# Patient Record
Sex: Male | Born: 1965 | Race: White | Hispanic: No | Marital: Married | Smoking: Never smoker
Health system: Southern US, Community
[De-identification: ages and names within clinical notes are randomized; demographics above are authoritative.]

## PROBLEM LIST (undated history)

## (undated) DIAGNOSIS — L0291 Cutaneous abscess, unspecified: Secondary | ICD-10-CM

## (undated) DIAGNOSIS — Z1589 Genetic susceptibility to other disease: Secondary | ICD-10-CM

## (undated) DIAGNOSIS — I10 Essential (primary) hypertension: Secondary | ICD-10-CM

## (undated) DIAGNOSIS — N183 Chronic kidney disease, stage 3 unspecified: Secondary | ICD-10-CM

## (undated) DIAGNOSIS — E785 Hyperlipidemia, unspecified: Secondary | ICD-10-CM

## (undated) DIAGNOSIS — I712 Thoracic aortic aneurysm, without rupture, unspecified: Secondary | ICD-10-CM

## (undated) DIAGNOSIS — M6282 Rhabdomyolysis: Secondary | ICD-10-CM

## (undated) HISTORY — DX: Cutaneous abscess, unspecified: L02.91

## (undated) HISTORY — PX: SPINE SURGERY: SHX786

## (undated) HISTORY — DX: Chronic kidney disease, stage 3 unspecified: N18.30

## (undated) HISTORY — DX: Essential (primary) hypertension: I10

## (undated) HISTORY — DX: Genetic susceptibility to other disease: Z15.89

## (undated) HISTORY — DX: Rhabdomyolysis: M62.82

## (undated) HISTORY — PX: IRRIGATION AND DEBRIDEMENT ABSCESS: SHX5252

## (undated) HISTORY — DX: Thoracic aortic aneurysm, without rupture, unspecified: I71.20

## (undated) HISTORY — DX: Hyperlipidemia, unspecified: E78.5

---

## 2000-11-17 ENCOUNTER — Ambulatory Visit: Admission: RE | Admit: 2000-11-17 | Discharge: 2000-11-17 | Payer: Self-pay | Admitting: Urology

## 2000-11-17 ENCOUNTER — Encounter: Payer: Self-pay | Admitting: Urology

## 2006-07-11 ENCOUNTER — Ambulatory Visit: Payer: Self-pay | Admitting: Internal Medicine

## 2006-07-15 ENCOUNTER — Encounter: Admission: RE | Admit: 2006-07-15 | Discharge: 2006-07-15 | Payer: Self-pay | Admitting: Internal Medicine

## 2006-08-13 ENCOUNTER — Inpatient Hospital Stay (HOSPITAL_COMMUNITY): Admission: RE | Admit: 2006-08-13 | Discharge: 2006-08-15 | Payer: Self-pay | Admitting: Neurosurgery

## 2006-10-27 ENCOUNTER — Encounter: Payer: Self-pay | Admitting: Internal Medicine

## 2007-02-26 ENCOUNTER — Encounter: Payer: Self-pay | Admitting: Internal Medicine

## 2008-04-14 ENCOUNTER — Encounter: Payer: Self-pay | Admitting: Internal Medicine

## 2009-06-20 ENCOUNTER — Encounter: Payer: Self-pay | Admitting: Internal Medicine

## 2010-10-11 NOTE — Letter (Signed)
Summary: [facility]  [facility]   Imported By: Maryln Gottron 02/01/2010 11:31:24  _____________________________________________________________________  External Attachment:    Type:   Image     Comment:   External Document

## 2010-10-11 NOTE — Letter (Signed)
Summary: [facility]  [facility]   Imported By: Maryln Gottron 02/01/2010 11:30:18  _____________________________________________________________________  External Attachment:    Type:   Image     Comment:   External Document

## 2011-09-06 ENCOUNTER — Inpatient Hospital Stay (HOSPITAL_COMMUNITY)
Admission: EM | Admit: 2011-09-06 | Discharge: 2011-09-07 | DRG: 278 | Disposition: A | Discharge status: Home / Self Care | Payer: BC Managed Care – PPO | Attending: General Surgery | Admitting: General Surgery

## 2011-09-06 ENCOUNTER — Other Ambulatory Visit: Payer: Self-pay

## 2011-09-06 ENCOUNTER — Encounter (HOSPITAL_COMMUNITY): Payer: Self-pay | Admitting: Registered Nurse

## 2011-09-06 ENCOUNTER — Emergency Department (HOSPITAL_COMMUNITY): Payer: BC Managed Care – PPO

## 2011-09-06 ENCOUNTER — Encounter (HOSPITAL_COMMUNITY)
Admission: EM | Disposition: A | Discharge status: Home / Self Care | Payer: Self-pay | Source: Home / Self Care | Attending: General Surgery

## 2011-09-06 ENCOUNTER — Encounter: Payer: Self-pay | Admitting: Emergency Medicine

## 2011-09-06 ENCOUNTER — Emergency Department (HOSPITAL_COMMUNITY): Payer: BC Managed Care – PPO | Admitting: Registered Nurse

## 2011-09-06 DIAGNOSIS — L03119 Cellulitis of unspecified part of limb: Principal | ICD-10-CM | POA: Diagnosis present

## 2011-09-06 DIAGNOSIS — L02419 Cutaneous abscess of limb, unspecified: Principal | ICD-10-CM | POA: Diagnosis present

## 2011-09-06 LAB — CBC
Platelets: 306 10*3/uL (ref 150–400)
RBC: 5.45 MIL/uL (ref 4.22–5.81)
RDW: 13.6 % (ref 11.5–15.5)
WBC: 9.8 10*3/uL (ref 4.0–10.5)

## 2011-09-06 LAB — DIFFERENTIAL
Basophils Absolute: 0.1 10*3/uL (ref 0.0–0.1)
Lymphocytes Relative: 13 % (ref 12–46)
Lymphs Abs: 1.2 10*3/uL (ref 0.7–4.0)
Neutrophils Relative %: 75 % (ref 43–77)

## 2011-09-06 LAB — POCT I-STAT, CHEM 8
BUN: 12 mg/dL (ref 6–23)
Creatinine, Ser: 1.3 mg/dL (ref 0.50–1.35)
Hemoglobin: 18 g/dL — ABNORMAL HIGH (ref 13.0–17.0)
Potassium: 4.2 mEq/L (ref 3.5–5.1)
Sodium: 138 mEq/L (ref 135–145)

## 2011-09-06 SURGERY — INCISION AND DRAINAGE, ABSCESS
Anesthesia: General | Site: Thigh | Laterality: Left | Wound class: Dirty or Infected

## 2011-09-06 MED ORDER — FENTANYL CITRATE 0.05 MG/ML IJ SOLN
INTRAMUSCULAR | Status: AC
  Filled 2011-09-06: qty 2

## 2011-09-06 MED ORDER — BUPIVACAINE-EPINEPHRINE 0.25% -1:200000 IJ SOLN
INTRAMUSCULAR | Status: AC
  Filled 2011-09-06: qty 1

## 2011-09-06 MED ORDER — IOHEXOL 300 MG/ML  SOLN: 100.0000 mL | Freq: Once | INTRAMUSCULAR | Status: DC | PRN

## 2011-09-06 MED ORDER — LACTATED RINGERS IV SOLN: INTRAVENOUS | Status: DC

## 2011-09-06 MED ORDER — MIDAZOLAM HCL 5 MG/5ML IJ SOLN
INTRAMUSCULAR | Status: DC | PRN
  Administered 2011-09-06: 2 mg via INTRAVENOUS

## 2011-09-06 MED ORDER — PROPOFOL 10 MG/ML IV EMUL
INTRAVENOUS | Status: DC | PRN
  Administered 2011-09-06: 200 mg via INTRAVENOUS

## 2011-09-06 MED ORDER — FENTANYL CITRATE 0.05 MG/ML IJ SOLN
INTRAMUSCULAR | Status: DC | PRN
  Administered 2011-09-06 (×2): 50 ug via INTRAVENOUS
  Administered 2011-09-06: 100 ug via INTRAVENOUS
  Administered 2011-09-06: 50 ug via INTRAVENOUS

## 2011-09-06 MED ORDER — BUPIVACAINE-EPINEPHRINE 0.25% -1:200000 IJ SOLN
INTRAMUSCULAR | Status: DC | PRN
  Administered 2011-09-06: 40 mL

## 2011-09-06 MED ORDER — FENTANYL CITRATE 0.05 MG/ML IJ SOLN: 25.0000 ug | INTRAMUSCULAR | Status: DC | PRN

## 2011-09-06 MED ORDER — PROMETHAZINE HCL 25 MG/ML IJ SOLN: 6.2500 mg | INTRAMUSCULAR | Status: DC | PRN

## 2011-09-06 MED ORDER — VANCOMYCIN HCL IN DEXTROSE 1-5 GM/200ML-% IV SOLN
1000.0000 mg | Freq: Once | INTRAVENOUS | Status: AC
  Administered 2011-09-06: 1000 mg via INTRAVENOUS
  Filled 2011-09-06: qty 200

## 2011-09-06 MED ORDER — 0.9 % SODIUM CHLORIDE (POUR BTL) OPTIME
TOPICAL | Status: DC | PRN
  Administered 2011-09-06: 1000 mL

## 2011-09-06 MED ORDER — ONDANSETRON HCL 4 MG/2ML IJ SOLN
INTRAMUSCULAR | Status: DC | PRN
  Administered 2011-09-06: 4 mg via INTRAVENOUS

## 2011-09-06 MED ORDER — HYDROMORPHONE HCL PF 1 MG/ML IJ SOLN
INTRAMUSCULAR | Status: DC | PRN
  Administered 2011-09-06 (×2): 1 mg via INTRAVENOUS

## 2011-09-06 MED ORDER — LACTATED RINGERS IV SOLN
INTRAVENOUS | Status: DC | PRN
  Administered 2011-09-06 (×2): via INTRAVENOUS

## 2011-09-06 MED ORDER — LIDOCAINE HCL 1 % IJ SOLN
INTRAMUSCULAR | Status: DC | PRN
  Administered 2011-09-06: 100 mg via INTRADERMAL

## 2011-09-06 SURGICAL SUPPLY — 34 items
BANDAGE GAUZE ELAST BULKY 4 IN (GAUZE/BANDAGES/DRESSINGS) ×1 IMPLANT
BLADE SURG 15 STRL LF DISP TIS (BLADE) ×1 IMPLANT
BLADE SURG 15 STRL SS (BLADE)
CANISTER SUCTION 2500CC (MISCELLANEOUS) ×2 IMPLANT
CLOTH BEACON ORANGE TIMEOUT ST (SAFETY) ×2 IMPLANT
COVER SURGICAL LIGHT HANDLE (MISCELLANEOUS) ×2 IMPLANT
DECANTER SPIKE VIAL GLASS SM (MISCELLANEOUS) ×1 IMPLANT
DRAPE LAPAROSCOPIC ABDOMINAL (DRAPES) IMPLANT
DRSG PAD ABDOMINAL 8X10 ST (GAUZE/BANDAGES/DRESSINGS) ×1 IMPLANT
ELECT CAUTERY BLADE 6.4 (BLADE) ×1 IMPLANT
ELECT REM PT RETURN 9FT ADLT (ELECTROSURGICAL) ×2
ELECTRODE REM PT RTRN 9FT ADLT (ELECTROSURGICAL) ×1 IMPLANT
GLOVE BIO SURGEON STRL SZ7.5 (GLOVE) ×2 IMPLANT
GLOVE SURG SS PI 6.5 STRL IVOR (GLOVE) ×2 IMPLANT
GOWN BRE IMP SLV SIRUS LXLNG (GOWN DISPOSABLE) ×1 IMPLANT
GOWN STRL NON-REIN LRG LVL3 (GOWN DISPOSABLE) ×4 IMPLANT
KIT BASIN OR (CUSTOM PROCEDURE TRAY) ×2 IMPLANT
NDL HYPO 25X1 1.5 SAFETY (NEEDLE) IMPLANT
NEEDLE HYPO 25X1 1.5 SAFETY (NEEDLE) ×2 IMPLANT
NS IRRIG 1000ML POUR BTL (IV SOLUTION) ×2 IMPLANT
PENCIL BUTTON HOLSTER BLD 10FT (ELECTRODE) ×1 IMPLANT
SPONGE GAUZE 4X4 12PLY (GAUZE/BANDAGES/DRESSINGS) ×1 IMPLANT
SPONGE LAP 18X18 X RAY DECT (DISPOSABLE) ×1 IMPLANT
SUT MNCRL AB 4-0 PS2 18 (SUTURE) IMPLANT
SUT VIC AB 3-0 SH 27 (SUTURE)
SUT VIC AB 3-0 SH 27XBRD (SUTURE) IMPLANT
SWAB COLLECTION DEVICE MRSA (MISCELLANEOUS) IMPLANT
SYR BULB 3OZ (MISCELLANEOUS) ×1 IMPLANT
SYR CONTROL 10ML LL (SYRINGE) ×1 IMPLANT
TAPE HYPAFIX 4 X10 (GAUZE/BANDAGES/DRESSINGS) ×1 IMPLANT
TOWEL OR 17X26 10 PK STRL BLUE (TOWEL DISPOSABLE) ×2 IMPLANT
TUBE ANAEROBIC SPECIMEN COL (MISCELLANEOUS) ×2 IMPLANT
WATER STERILE IRR 1000ML POUR (IV SOLUTION) IMPLANT
YANKAUER SUCT BULB TIP NO VENT (SUCTIONS) ×1 IMPLANT

## 2011-09-06 NOTE — Anesthesia Postprocedure Evaluation (Signed)
  Anesthesia Post-op Note  Patient: Brandon Phelps  Procedure(s) Performed:  INCISION AND DRAINAGE ABSCESS  Patient Location: PACU  Anesthesia Type: General  Level of Consciousness: awake, alert , oriented and patient cooperative  Airway and Oxygen Therapy: Patient Spontanous Breathing and Patient connected to nasal cannula oxygen  Post-op Pain: none  Post-op Assessment: Post-op Vital signs reviewed, Patient's Cardiovascular Status Stable and Respiratory Function Stable  Post-op Vital Signs: stable  Complications: No apparent anesthesia complications

## 2011-09-06 NOTE — Op Note (Signed)
Preoperative Diagnosis: ABCESS ON LEG abscess left thigh  Postoprative Diagnosis: ABCESS ON LEG abscess left thigh  Procedure: Procedure(s): INCISION AND DRAINAGE ABSCESS   Surgeon: Glenna Fellows T   Assistants: none  Anesthesia:  General endotracheal anesthesiaDiagnos  Indications:  Patient presents with a 2 to three-week history of increasing swelling and redness of his proximal left thigh. He partially treated this himself with antibiotics. He has a 10-15 cm area of firmness swelling and erythema over his proximal lateral left thigh. CT scan has shown an apparent multiloculated abscess in the deep soft tissue extending down to the superficial muscle. I recommended incision and drainage is brought to the operating room for this procedure. We discussed the nature of the procedure, risks of bleeding, infection, anesthetic complications and expected recovery and he is agreeable.   Procedure Detail: The patient the operating room and placed in the supine position on the operating table.Gen. Endotracheal anesthesia was induced. The thigh was sterilely prepped and draped. I made a longitudinal incision over the center of the area swelling and dissection was carried down through the subcutaneous tissue with cautery. In the deep subcutaneous tissue an abscess cavity was entered and a large amount of fairly thin gray green purulent fluid with what appeared to be some bright yellow flecks of possibly necrotic fat were drained. There were several loculations were broken up with careful blunt dissection and extended down to the fascia over the thigh muscles but really not down into the muscles. The drainage was cultured. The cavity was thoroughly irrigated with saline. Hemostasis was obtained with cautery. The soft tissue is infiltrated with Marcaine with epinephrine. The cavity was then packed with moist saline gauze. Dry dressing was applied and the patient taken to recovery room. Sponge needle  counts were correct.   Estimated Blood Loss:  less than 50 mL         Drains: none  Blood Given: none          Specimens: culture and sensitivity of abscess fluid        Complications:  * No complications entered in OR log *         Disposition: PACU - hemodynamically stable.         Condition: stable  Mariella Saa MD, FACS  09/06/2011, 10:43 PM

## 2011-09-06 NOTE — ED Notes (Signed)
Unable to obtain wound culture due to bleeding. Pressure dressing applied

## 2011-09-06 NOTE — ED Notes (Signed)
Pt transferred to surgery

## 2011-09-06 NOTE — ED Provider Notes (Signed)
History     CSN: [id number]  Arrival date & time 09/06/11  1517   First MD Initiated Contact with Patient 09/06/11 1549      Chief Complaint  Patient presents with  . Abscess    (Consider location/radiation/quality/duration/timing/severity/associated sxs/prior treatment) Patient is a 45 y.o. male presenting with abscess. The history is provided by the patient.  Abscess    patient presents with left thigh swelling x3 days. Patient had been diagnosed with having an abscess and placed on Bactrim. Symptoms have not been improving. He denies any fever or vomiting. Seen by his primary care physician today and the abscess was partially drained and packed with gauze. Patient sent to the ER due to concern for deep abscess. Pain is described as dull and made better with nothing and worse with ambulation.  History reviewed. No pertinent past medical history.  History reviewed. No pertinent past surgical history.  History reviewed. No pertinent family history.  History  Substance Use Topics  . Smoking status: Never Smoker   . Smokeless tobacco: Not on file  . Alcohol Use: No      Review of Systems  All other systems reviewed and are negative.    Allergies  Review of patient's allergies indicates no known allergies.  Home Medications   Current Outpatient Rx  Name Route Sig Dispense Refill  . SULFAMETHOXAZOLE-TRIMETHOPRIM 400-80 MG PO TABS Oral Take 1 tablet by mouth 2 (two) times daily.        BP 159/89  Pulse 109  Temp(Src) 98.5 F (36.9 C) (Oral)  Resp 20  SpO2 98%  Physical Exam  Nursing note and vitals reviewed. Constitutional: He is oriented to person, place, and time. He appears well-developed and well-nourished.  Non-toxic appearance. No distress.  HENT:  Head: Normocephalic and atraumatic.  Eyes: Conjunctivae, EOM and lids are normal. Pupils are equal, round, and reactive to light.  Neck: Normal range of motion. Neck supple. No tracheal deviation present. No  mass present.  Cardiovascular: Regular rhythm and normal heart sounds.  Tachycardia present.  Exam reveals no gallop.   No murmur heard. Pulmonary/Chest: Effort normal and breath sounds normal. No stridor. No respiratory distress. He has no decreased breath sounds. He has no wheezes. He has no rhonchi. He has no rales.  Abdominal: Soft. Normal appearance and bowel sounds are normal. He exhibits no distension. There is no tenderness. There is no rebound and no CVA tenderness.  Musculoskeletal: Normal range of motion. He exhibits no edema and no tenderness.       Legs: Neurological: He is alert and oriented to person, place, and time. He has normal strength. No cranial nerve deficit or sensory deficit. GCS eye subscore is 4. GCS verbal subscore is 5. GCS motor subscore is 6.  Skin: Skin is warm and dry. No abrasion and no rash noted.  Psychiatric: He has a normal mood and affect. His speech is normal and behavior is normal.    ED Course  Procedures (including critical care time)   Labs Reviewed  CBC  DIFFERENTIAL  I-STAT, CHEM 8  WOUND CULTURE   No results found.   No diagnosis found.    MDM  Patient started on vancomycin and spoke with the surgeon on call, he will come to see the pt        Toy Baker, MD 09/06/11 [phone]

## 2011-09-06 NOTE — H&P (Signed)
Brandon Phelps is an 45 y.o. male.   Chief Complaint: swelling and redness left thigh HPI: patient is a 44 year old male who about 2 weeks ago began to notice some redness and swelling in his proximal anterior thigh. He does not remember any injury or skin lesion. He has no previous history of soft tissue infections. The area got worse for several days. 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 The area persisted and got a little worse. He saw his primary physician today who unsuccessfully attempted incision and drainage in the office and he was then sent to wasn't long emergency room for evaluation. He denies fever or chills. It is mildly painful.  History reviewed. No pertinent past medical history.  Past Surgical History  Procedure Date  . Spine surgery Cervical Fusion    History reviewed. No pertinent family history. Social History:  reports that he has never smoked. He does not have any smokeless tobacco history on file. He reports that he does not drink alcohol or use illicit drugs.  Allergies: No Known Allergies  Medications Prior to Admission  Medication Dose Route Frequency Provider Last Rate Last Dose  . iohexol (OMNIPAQUE) 300 MG/ML solution 100 mL  100 mL Intravenous Once PRN Medication Radiologist      . vancomycin (VANCOCIN) IVPB 1000 mg/200 mL premix  1,000 mg Intravenous Once Toy Baker, MD   1,000 mg at 09/06/11 1755   No current outpatient prescriptions on file as of 09/06/2011.    Results for orders placed during the hospital encounter of 09/06/11 (from the past 48 hour(s))  CBC     Status: Normal   Collection Time   09/06/11  4:50 PM      Component Value Range Comment   WBC 9.8  4.0 - 10.5 (K/uL)    RBC 5.45  4.22 - 5.81 (MIL/uL)    Hemoglobin 16.4  13.0 - 17.0 (g/dL)    HCT 16.1  09.6 - 04.5 (%)    MCV 90.5  78.0 - 100.0 (fL)    MCH 30.1  26.0 - 34.0 (pg)    MCHC 33.3  30.0 - 36.0 (g/dL)    RDW 40.9  81.1 - 91.4 (%)    Platelets 306  150 - 400 (K/uL)   DIFFERENTIAL     Status: Abnormal   Collection Time   09/06/11  4:50 PM      Component Value Range Comment   Neutrophils Relative 75  43 - 77 (%)    Neutro Abs 7.3  1.7 - 7.7 (K/uL)    Lymphocytes Relative 13  12 - 46 (%)    Lymphs Abs 1.2  0.7 - 4.0 (K/uL)    Monocytes Relative 11  3 - 12 (%)    Monocytes Absolute 1.1 (*) 0.1 - 1.0 (K/uL)    Eosinophils Relative 1  0 - 5 (%)    Eosinophils Absolute 0.1  0.0 - 0.7 (K/uL)    Basophils Relative 1  0 - 1 (%)    Basophils Absolute 0.1  0.0 - 0.1 (K/uL)   POCT I-STAT, CHEM 8     Status: Abnormal   Collection Time   09/06/11  5:09 PM      Component Value Range Comment   Sodium 138  135 - 145 (mEq/L)    Potassium 4.2  3.5 - 5.1 (mEq/L)    Chloride 99  96 - 112 (mEq/L)  BUN 12  6 - 23 (mg/dL)    Creatinine, Ser 0.45  0.50 - 1.35 (mg/dL)    Glucose, Bld 83  70 - 99 (mg/dL)    Calcium, Ion 4.09  1.12 - 1.32 (mmol/L)    TCO2 31  0 - 100 (mmol/L)    Hemoglobin 18.0 (*) 13.0 - 17.0 (g/dL)    HCT 81.1 (*) 91.4 - 52.0 (%)    Ct Femur Left W Contrast  09/06/2011  *RADIOLOGY REPORT*  Clinical Data: Left anterior thigh swelling.  CT OF THE LEFT FEMUR WITH CONTRAST  Technique:  50 days axial CT images were obtained through the left femur after IV contrast, and multiplanar reformatting was performed.  Contrast:  100 ml Omnipaque-300  Comparison: None.  Findings: An irregular and multilobular abscess measuring 9.7 cm craniocaudad by 5.6 cm transverse by 3.9 cm anterior - posterior is present along the left anterior upper thigh, with components in the subcutaneous tissues and extending between the tensor fascia lata muscle and a rectus femoris muscle.  Surrounding subcutaneous edema is present, and on images 24-27 of series 2 there appears to be some subcutaneous gas and skin irregularity, possibly from recent drainage or sinus track formation.  The abscess may be slightly extending into the  rectus femoris muscle, and involvement is approximately from the vertical level of the lesser trochanter extending inferiorly.  No other abscess is observed.  No knee effusion is identified. Aside from in the direct vicinity of the abscess, fat planes along the muscle groups appear preserved.  No hip effusion is identified.  IMPRESSION:  1.  Abscess of the left upper thigh, with involvement of the subcutaneous tissues and likely extension into the anterior compartment, particularly between the tensor fascia lata and rectus femoris muscle, and possibly extending into the rectus femoris.  Original Report Authenticated By: Dellia Cloud, M.D.    Review of Systems  Constitutional: Negative for fever and chills.  Respiratory: Negative.   Cardiovascular: Negative.   Gastrointestinal: Negative.   Genitourinary: Negative.     Blood pressure 159/89, pulse 109, temperature 98.5 F (36.9 C), temperature source Oral, resp. rate 20, SpO2 98.00%. Physical Exam  General: Alert and healthy-appearing Caucasian male in no acute distress Skin: Warm and dry without rash or infection HEENT: No palpable mass or thyromegaly. Sclera nonicteric Lungs: Clear bilaterally without increased work of breathing Cardiovascular: Regular mild tachycardia. No murmur. No JVD or edema. Peripheral pulses intact. Abdomen: Soft and nontender. Nondistended. Extremities: In the proximal anterolateral left thigh a separate centimeters below the anterior superior iliac spine is an approximately 10-12 cm area of swelling, induration, and moderate overlying erythema. No definite fluctuance. There is a small stab wound centrally with a tiny bit of bloody drainage. Neurologic: Alert and fully oriented. Affect normal. Assessment/Plan Apparent large abscess involving the soft tissues of the proximal anterior left thigh. The source is not clear. He does not have fever or elevated white count but this has been partially treated with  antibiotics. I have reviewed the CT scan and there appears to be a fairly large multiloculated fluid collection extending down to the anterior musculature. I believe this will require open incision and drainage. I discussed this with the patient and his wife. 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 However I do not feel it is safe to manage this nonoperatively particularly with the CT scan findings. After discussion he  is agreeable to surgery and we will plan incision and drainage in the operating room tonight. We discussed the indications for the procedure, use of general anesthesia, and expected recovery period  Malu Pellegrini T 09/06/2011, 7:51 PM

## 2011-09-06 NOTE — ED Notes (Signed)
Pt has abscess on left upper thigh, swelling and redness noted. Went to PCP and I&D was performed but still got referred here to "have a [place]". Pt report that pcp didn't drained the whole abscess.

## 2011-09-06 NOTE — ED Notes (Addendum)
Pt is currently receiving antibiotic IV. Dr. Freida Busman informs to hold off the wound culture for now.

## 2011-09-06 NOTE — Anesthesia Preprocedure Evaluation (Signed)
Anesthesia Evaluation  Patient identified by MRN, date of birth, ID band Patient awake    Reviewed: Allergy & Precautions, H&P , NPO status , Patient's Chart, lab work & pertinent test results, reviewed documented beta blocker date and time   Airway Mallampati: I TM Distance: >3 FB Neck ROM: full    Dental No notable dental hx. (+) Teeth Intact and Dental Advidsory Given   Pulmonary neg pulmonary ROS,  clear to auscultation  Pulmonary exam normal       Cardiovascular Exercise Tolerance: Good neg cardio ROS regular Normal    Neuro/Psych Negative Neurological ROS  Negative Psych ROS   GI/Hepatic negative GI ROS, Neg liver ROS,   Endo/Other  Negative Endocrine ROS  Renal/GU negative Renal ROS  Genitourinary negative   Musculoskeletal   Abdominal Normal abdominal exam  (+)   Peds  Hematology negative hematology ROS (+)   Anesthesia Other Findings   Reproductive/Obstetrics negative OB ROS                           Anesthesia Physical Anesthesia Plan  ASA: I and Emergent  Anesthesia Plan: General   Post-op Pain Management:    Induction:   Airway Management Planned:   Additional Equipment:   Intra-op Plan:   Post-operative Plan:   Informed Consent: I have reviewed the patients History and Physical, chart, labs and discussed the procedure including the risks, benefits and alternatives for the proposed anesthesia with the patient or authorized representative who has indicated his/her understanding and acceptance.   Dental Advisory Given  Plan Discussed with: CRNA  Anesthesia Plan Comments:         Anesthesia Quick Evaluation

## 2011-09-06 NOTE — ED Notes (Signed)
Pt in c/o abscess to left leg, states he went to [facility] today and they were unable to open it up, sent here due to redness and swelling down to top of left knee, pt states he has been on antibiotic for this

## 2011-09-06 NOTE — Transfer of Care (Signed)
Immediate Anesthesia Transfer of Care Note  Patient: Brandon Phelps  Procedure(s) Performed:  INCISION AND DRAINAGE ABSCESS  Patient Location: PACU  Anesthesia Type: General  Level of Consciousness: awake and sedated  Airway & Oxygen Therapy: Patient Spontanous Breathing and Patient connected to face mask oxygen  Post-op Assessment: Report given to PACU RN and Post -op Vital signs reviewed and stable  Post vital signs: stable  Complications: No apparent anesthesia complications

## 2011-09-07 LAB — CBC
HCT: 45 % (ref 39.0–52.0)
MCH: 29.1 pg (ref 26.0–34.0)
MCHC: 32 g/dL (ref 30.0–36.0)
MCV: 91.1 fL (ref 78.0–100.0)
RDW: 13.6 % (ref 11.5–15.5)

## 2011-09-07 LAB — BASIC METABOLIC PANEL
BUN: 10 mg/dL (ref 6–23)
CO2: 30 mEq/L (ref 19–32)
Chloride: 98 mEq/L (ref 96–112)
Creatinine, Ser: 1.4 mg/dL — ABNORMAL HIGH (ref 0.50–1.35)
Glucose, Bld: 137 mg/dL — ABNORMAL HIGH (ref 70–99)

## 2011-09-07 MED ORDER — PIPERACILLIN-TAZOBACTAM 3.375 G IVPB
3.3750 g | Freq: Three times a day (TID) | INTRAVENOUS | Status: DC
  Administered 2011-09-07 (×2): 3.375 g via INTRAVENOUS
  Filled 2011-09-07 (×5): qty 50

## 2011-09-07 MED ORDER — VANCOMYCIN HCL 1000 MG IV SOLR
1250.0000 mg | Freq: Two times a day (BID) | INTRAVENOUS | Status: DC
  Administered 2011-09-07: 1250 mg via INTRAVENOUS
  Filled 2011-09-07 (×3): qty 1250

## 2011-09-07 MED ORDER — HYDROCODONE-ACETAMINOPHEN 5-325 MG PO TABS: 1.0000 | ORAL_TABLET | ORAL | Status: DC | PRN

## 2011-09-07 MED ORDER — MORPHINE SULFATE 2 MG/ML IJ SOLN: 2.0000 mg | INTRAMUSCULAR | Status: DC | PRN

## 2011-09-07 MED ORDER — KCL IN DEXTROSE-NACL 20-5-0.9 MEQ/L-%-% IV SOLN
INTRAVENOUS | Status: DC
  Administered 2011-09-07: 100 mL via INTRAVENOUS
  Filled 2011-09-07 (×4): qty 1000

## 2011-09-07 MED ORDER — ACETAMINOPHEN 650 MG RE SUPP: 650.0000 mg | Freq: Four times a day (QID) | RECTAL | Status: DC | PRN

## 2011-09-07 MED ORDER — HEPARIN SODIUM (PORCINE) 5000 UNIT/ML IJ SOLN
5000.0000 [IU] | Freq: Three times a day (TID) | INTRAMUSCULAR | Status: DC
  Administered 2011-09-07: 5000 [IU] via SUBCUTANEOUS
  Filled 2011-09-07 (×4): qty 1

## 2011-09-07 MED ORDER — OXYCODONE HCL 5 MG PO TABS: 5.0000 mg | ORAL_TABLET | ORAL | Status: DC | PRN

## 2011-09-07 MED ORDER — ACETAMINOPHEN 325 MG PO TABS: 650.0000 mg | ORAL_TABLET | Freq: Four times a day (QID) | ORAL | Status: DC | PRN

## 2011-09-07 MED ORDER — AMOXICILLIN-POT CLAVULANATE 875-125 MG PO TABS: 1.0000 | ORAL_TABLET | Freq: Two times a day (BID) | ORAL | Status: AC

## 2011-09-07 MED ORDER — ONDANSETRON HCL 4 MG/2ML IJ SOLN: 4.0000 mg | Freq: Four times a day (QID) | INTRAMUSCULAR | Status: DC | PRN

## 2011-09-07 NOTE — Progress Notes (Signed)
ANTIBIOTIC CONSULT NOTE - INITIAL  Pharmacy Consult for Vancomycin Indication: cellulitis  No Known Allergies  Patient Measurements: Height: 6\' 1"  (185.4 cm) Weight: 224 lb (101.606 kg) IBW/kg (Calculated) : 79.9  Adjusted Body Weight:   Vital Signs: Temp: 97.8 F (36.6 C) (12/29 0045) Temp src: Oral (12/29 0045) BP: 113/77 mmHg (12/29 0045) Pulse Rate: 76  (12/29 0045) Intake/Output from previous day: 12/28 0701 - 12/29 0700 In: 1440 [P.O.:240; I.V.:1200] Out: 100 [Blood:100] Intake/Output from this shift: Total I/O In: 1440 [P.O.:240; I.V.:1200] Out: 100 [Blood:100]  Labs:  Basename 09/06/11 1709 09/06/11 1650  WBC -- 9.8  HGB 18.0* 16.4  PLT -- 306  LABCREA -- --  CREATININE 1.30 --   Estimated Creatinine Clearance: 91.8 ml/min (by C-G formula based on Cr of 1.3). No results found for this basename: VANCOTROUGH:2,VANCOPEAK:2,VANCORANDOM:2,GENTTROUGH:2,GENTPEAK:2,GENTRANDOM:2,TOBRATROUGH:2,TOBRAPEAK:2,TOBRARND:2,AMIKACINPEAK:2,AMIKACINTROU:2,AMIKACIN:2, in the last 72 hours   Microbiology: No results found for this or any previous visit (from the past 720 hour(s)).  Medical History: History reviewed. No pertinent past medical history.  Medications:  Anti-infectives     Start     Dose/Rate Route Frequency Ordered Stop   09/07/11 0600   vancomycin (VANCOCIN) 1,250 mg in sodium chloride 0.9 % 250 mL IVPB        1,250 mg 166.7 mL/hr over 90 Minutes Intravenous Every 12 hours 09/07/11 0149     09/07/11 0004   piperacillin-tazobactam (ZOSYN) IVPB 3.375 g        3.375 g 12.5 mL/hr over 240 Minutes Intravenous 3 times per day 09/07/11 0004     09/06/11 1730   vancomycin (VANCOCIN) IVPB 1000 mg/200 mL premix        1,000 mg 200 mL/hr over 60 Minutes Intravenous  Once 09/06/11 1721 09/06/11 1855         Assessment: Patient with cellulitis.  First dose of antibiotics already given.   Goal of Therapy:  Vancomycin trough level 10-15 mcg/ml  Plan:  Measure  antibiotic drug levels at steady state Follow up culture results Vancomycin 1250mg  iv q12hr  Brandon Phelps, Brandon Phelps 09/07/2011,1:51 AM

## 2011-09-07 NOTE — Discharge Summary (Signed)
   Patient ID: Brandon Phelps [id number] 45 y.o. 1966/06/24  09/06/2011  Discharge date and time: 09/07/2011   Admitting Physician: Glenna Fellows T  Discharge Physician: Glenna Fellows T  Admission Diagnoses: ABCESS ON LEG abscess left thigh  Discharge Diagnoses: same  Operations: Procedure(s): INCISION AND DRAINAGE ABSCESS  Admission Condition: fair  Discharged Condition: good  Hospital Course: patient is a 45 year old male who presented with a three-week history of swelling redness and pain in his left anterior thigh. Exam showed a 8-10 cm area of erythema swelling and induration. CT confirmed a multiloculated abscess in the deep soft tissue. He underwent incision and drainage in the operating room. He received IV item mycin and Zosyn. On the first postoperative day he was feeling better. The wound was repacked and very clean. The erythema was markedly improved and he was afebrile with a normal white count. Cultures are pending. He is discharged home on oral Augmentin. Wound packing was explained. He is to return to the office in 6 days.  Consults: none   Treatments: surgery: incision and drainage of left thigh abscess  Disposition: Home  Patient Instructions:   Brandon Phelps, Brandon Phelps  Home Medication Instructions [id number]   Printed on:09/07/11 0859  Medication Information                    HYDROcodone-acetaminophen (NORCO) 5-325 MG per tablet Take 1-2 tablets by mouth every 4 (four) hours as needed for pain.           amoxicillin-clavulanate (AUGMENTIN) 875-125 MG per tablet Take 1 tablet by mouth 2 (two) times daily.             Activity: activity as tolerated Diet: regular diet Wound Care: daily  moist saline gauze wound pack  Follow-up:  With Dr. Johna Sheriff in 6 days.  Signed: Mariella Saa MD, FACS  09/07/2011, 8:59 AM

## 2011-09-07 NOTE — Progress Notes (Signed)
Patient ID: Brandon Phelps, male   DOB: 02-05-1966, 45 y.o.   MRN: [id number] 1 Day Post-Op  Subjective: No complaints. Denies pain.  Objective: Vital signs in last 24 hours: Temp:  [97.4 F (36.3 C)-98.6 F (37 C)] 97.8 F (36.6 C) (12/29 0610) Pulse Rate:  [60-109] 60  (12/29 0610) Resp:  [14-20] 18  (12/29 0610) BP: (101-159)/(57-89) 107/69 mmHg (12/29 0610) SpO2:  [91 %-98 %] 93 % (12/29 0610) FiO2 (%):  [2 %] 2 % (12/29 0045) Weight:  [224 lb (101.606 kg)] 224 lb (101.606 kg) (12/29 0100)    Intake/Output from previous day: 12/28 0701 - 12/29 0700 In: 1800 [P.O.:600; I.V.:1200] Out: 100 [Blood:100] Intake/Output this shift:    General appearance: alert and no distress Incision/Wound:left thigh wound repacked. It is clean with no purulent drainage. Erythema of his anterior thigh is markedly improved, essentially gone.  Lab Results:   Basename 09/07/11 0435 09/06/11 1709 09/06/11 1650  WBC 8.6 -- 9.8  HGB 14.4 18.0* --  HCT 45.0 53.0* --  PLT 282 -- 306   BMET  Basename 09/07/11 0435 09/06/11 1709  NA 135 138  K 4.6 4.2  CL 98 99  CO2 30 --  GLUCOSE 137* 83  BUN 10 12  CREATININE 1.40* 1.30  CALCIUM 9.0 --   PT/INR No results found for this basename: LABPROT:2,INR:2 in the last 72 hours ABG No results found for this basename: PHART:2,PCO2:2,PO2:2,HCO3:2 in the last 72 hours  Studies/Results: Ct Femur Left W Contrast  09/06/2011  *RADIOLOGY REPORT*  Clinical Data: Left anterior thigh swelling.  CT OF THE LEFT FEMUR WITH CONTRAST  Technique:  50 days axial CT images were obtained through the left femur after IV contrast, and multiplanar reformatting was performed.  Contrast:  100 ml Omnipaque-300  Comparison: None.  Findings: An irregular and multilobular abscess measuring 9.7 cm craniocaudad by 5.6 cm transverse by 3.9 cm anterior - posterior is present along the left anterior upper thigh, with components in the subcutaneous tissues and extending between the  tensor fascia lata muscle and a rectus femoris muscle.  Surrounding subcutaneous edema is present, and on images 24-27 of series 2 there appears to be some subcutaneous gas and skin irregularity, possibly from recent drainage or sinus track formation.  The abscess may be slightly extending into the rectus femoris muscle, and involvement is approximately from the vertical level of the lesser trochanter extending inferiorly.  No other abscess is observed.  No knee effusion is identified. Aside from in the direct vicinity of the abscess, fat planes along the muscle groups appear preserved.  No hip effusion is identified.  IMPRESSION:  1.  Abscess of the left upper thigh, with involvement of the subcutaneous tissues and likely extension into the anterior compartment, particularly between the tensor fascia lata and rectus femoris muscle, and possibly extending into the rectus femoris.  Original Report Authenticated By: Dellia Cloud, M.D.    Anti-infectives: Anti-infectives     Start     Dose/Rate Route Frequency Ordered Stop   09/07/11 0600   vancomycin (VANCOCIN) 1,250 mg in sodium chloride 0.9 % 250 mL IVPB        1,250 mg 166.7 mL/hr over 90 Minutes Intravenous Every 12 hours 09/07/11 0149     09/07/11 0004  piperacillin-tazobactam (ZOSYN) IVPB 3.375 g       3.375 g 12.5 mL/hr over 240 Minutes Intravenous 3 times per day 09/07/11 0004     09/06/11 1730   vancomycin (VANCOCIN)  IVPB 1000 mg/200 mL premix        1,000 mg 200 mL/hr over 60 Minutes Intravenous  Once 09/06/11 1721 09/06/11 1855          Assessment/Plan: s/p Procedure(s): INCISION AND DRAINAGE ABSCESS Doing well postoperatively. Signs of infection or much improved. I think he is stable for discharge. I instructed him in wound packing. He will be on Augmentin for one week and followup in the office in 6 days.   LOS: 1 day    Martell Mcfadyen T 09/07/2011

## 2011-09-09 ENCOUNTER — Telehealth (INDEPENDENT_AMBULATORY_CARE_PROVIDER_SITE_OTHER): Payer: Self-pay | Admitting: General Surgery

## 2011-09-10 LAB — CULTURE, ROUTINE-ABSCESS

## 2011-09-11 LAB — ANAEROBIC CULTURE

## 2011-09-13 ENCOUNTER — Ambulatory Visit (INDEPENDENT_AMBULATORY_CARE_PROVIDER_SITE_OTHER): Payer: BC Managed Care – PPO | Admitting: General Surgery

## 2011-09-13 ENCOUNTER — Encounter (INDEPENDENT_AMBULATORY_CARE_PROVIDER_SITE_OTHER): Payer: Self-pay | Admitting: General Surgery

## 2011-09-13 VITALS — BP 120/86 | HR 72 | Temp 98.6°F | Resp 12 | Ht 73.0 in | Wt 221.4 lb

## 2011-09-13 DIAGNOSIS — Z9889 Other specified postprocedural states: Secondary | ICD-10-CM

## 2011-09-13 NOTE — Patient Instructions (Signed)
Continue moist saline dressings until the skin is closed. Stop antibiotics after current prescription.

## 2011-09-13 NOTE — Progress Notes (Signed)
Patient returns one week following incision and drainage of a large abscess over his left anterior thigh. He reports he is doing well. He is up and around without difficulty.  Exam: Wound is all clean granulation about 4 x 2 x 2 cm and healing well. No induration or erythema.  Cultures did not grow any bacteria but he was on antibiotics preop  Assessment plan: Doing well following I&D of large thigh abscess. Continue current wound care. Return in 4 weeks.

## 2011-10-18 ENCOUNTER — Encounter (INDEPENDENT_AMBULATORY_CARE_PROVIDER_SITE_OTHER): Payer: BC Managed Care – PPO | Admitting: General Surgery

## 2015-04-28 ENCOUNTER — Encounter: Payer: Self-pay | Admitting: Family Medicine

## 2015-04-28 ENCOUNTER — Ambulatory Visit (INDEPENDENT_AMBULATORY_CARE_PROVIDER_SITE_OTHER): Payer: BLUE CROSS/BLUE SHIELD | Admitting: Family Medicine

## 2015-04-28 VITALS — BP 136/90 | HR 104 | Temp 98.1°F

## 2015-04-28 DIAGNOSIS — R000 Tachycardia, unspecified: Secondary | ICD-10-CM | POA: Diagnosis not present

## 2015-04-28 DIAGNOSIS — I309 Acute pericarditis, unspecified: Secondary | ICD-10-CM | POA: Diagnosis not present

## 2015-04-28 LAB — CBC WITH DIFFERENTIAL/PLATELET
BASOS ABS: 0.1 10*3/uL (ref 0.0–0.1)
Basophils Relative: 1 % (ref 0–1)
EOS PCT: 1 % (ref 0–5)
Eosinophils Absolute: 0.1 10*3/uL (ref 0.0–0.7)
HEMATOCRIT: 51 % (ref 39.0–52.0)
HEMOGLOBIN: 17.3 g/dL — AB (ref 13.0–17.0)
LYMPHS ABS: 1 10*3/uL (ref 0.7–4.0)
LYMPHS PCT: 16 % (ref 12–46)
MCH: 30.9 pg (ref 26.0–34.0)
MCHC: 33.9 g/dL (ref 30.0–36.0)
MCV: 91.1 fL (ref 78.0–100.0)
MONO ABS: 0.9 10*3/uL (ref 0.1–1.0)
MPV: 10.2 fL (ref 8.6–12.4)
Monocytes Relative: 14 % — ABNORMAL HIGH (ref 3–12)
Neutro Abs: 4.3 10*3/uL (ref 1.7–7.7)
Neutrophils Relative %: 68 % (ref 43–77)
Platelets: 196 10*3/uL (ref 150–400)
RBC: 5.6 MIL/uL (ref 4.22–5.81)
RDW: 14.3 % (ref 11.5–15.5)
WBC: 6.3 10*3/uL (ref 4.0–10.5)

## 2015-04-28 LAB — BASIC METABOLIC PANEL
BUN: 12 mg/dL (ref 7–25)
CHLORIDE: 101 mmol/L (ref 98–110)
CO2: 28 mmol/L (ref 20–31)
Calcium: 9.2 mg/dL (ref 8.6–10.3)
Creat: 1.48 mg/dL — ABNORMAL HIGH (ref 0.60–1.35)
GLUCOSE: 78 mg/dL (ref 70–99)
POTASSIUM: 4.4 mmol/L (ref 3.5–5.3)
Sodium: 139 mmol/L (ref 135–146)

## 2015-04-28 MED ORDER — METOPROLOL SUCCINATE ER 25 MG PO TB24: 25.0000 mg | ORAL_TABLET | Freq: Every day | ORAL | Status: DC

## 2015-04-28 NOTE — Progress Notes (Signed)
Subjective:    Patient ID: Brandon Phelps, male    DOB: [birth year], 49 y.o.   MRN: [id number]  HPI Patient is a very pleasant 49 year old white male with past medical history significant for hyperlipidemia. He also has a family history of coronary artery disease. However the patient is extremely active. He exercises routinely. He denies any dyspnea on exertion or angina. In fact this morning he played 18 holes of golf and walk the entire course without any chest pain or shortness of breath even in the heat. However earlier this week he developed low-grade fever and had symptoms consistent with a viral upper respiratory infection. Beginning last night he developed a rapid heartbeat. His heart rate is ranging between 90 and 100. He denies any shortness of breath. He does have some pain radiating in between his shoulder blades. This seems to be worse with twisting motions. EKG today shows normal sinus rhythm. There is depression in his PR intervals particularly in lead 2, 3, and aVF. He also has mild elevation in the ST segment in an upsloping pattern. I'm concerned this may represent viral pericarditis. He denies any shortness of breath. He does complain of a rapid heartbeat. He denies any pleurisy. He denies any hemoptysis. There is no swelling in his legs. He has no pitting edema. There is no evidence of a DVT. Past Medical History  Diagnosis Date  . Abscess     lt thigh   Past Surgical History  Procedure Laterality Date  . Spine surgery  Cervical Fusion  . Irrigation and debridement abscess      lt thigh   Current Outpatient Prescriptions on File Prior to Visit  Medication Sig Dispense Refill  . clobetasol (OLUX) 0.05 % topical foam Ad lib.     No current facility-administered medications on file prior to visit.   No Known Allergies Social History   Social History  . Marital Status: Married    Spouse Name: N/A  . Number of Children: N/A  . Years of Education: N/A   Occupational  History  . Not on file.   Social History Main Topics  . Smoking status: Never Smoker   . Smokeless tobacco: Not on file  . Alcohol Use: No  . Drug Use: No  . Sexual Activity: Not on file   Other Topics Concern  . Not on file   Social History Narrative      Review of Systems  All other systems reviewed and are negative.      Objective:   Physical Exam  Constitutional: He appears well-developed and well-nourished. No distress.  HENT:  Right Ear: External ear normal.  Left Ear: External ear normal.  Nose: Nose normal.  Mouth/Throat: Oropharynx is clear and moist. No oropharyngeal exudate.  Neck: Neck supple. No JVD present. No thyromegaly present.  Cardiovascular: Regular rhythm.  Tachycardia present.  Exam reveals no gallop and no friction rub.   No murmur heard. Pulmonary/Chest: Effort normal and breath sounds normal. No respiratory distress. He has no wheezes. He has no rales. He exhibits no tenderness.  Abdominal: Soft. Bowel sounds are normal.  Musculoskeletal: He exhibits no edema.  Lymphadenopathy:    He has no cervical adenopathy.  Skin: He is not diaphoretic.  Vitals reviewed.         Assessment & Plan:  Rapid heart beat - Plan: EKG 12-Lead, metoprolol succinate (TOPROL-XL) 25 MG 24 hr tablet, CBC with Differential, TSH, Basic Metabolic Panel  Acute pericarditis  Patient has tachycardia.  Given his abnormal EKG findings, his recent viral illness, his low-grade fever a few days ago, I believe the patient may have viral pericarditis. I have recommended that he take ibuprofen 800 mg every 8 hours. I will treat the tachycardia and the slight elevation in his blood pressure with Toprol-XL 25 mg by mouth daily. Certainly if the patient develops orthopnea, paroxysmal nocturnal dyspnea, or severe shortness of breath I want him to go immediately to the emergency room. I will also evaluate for other causes of tachycardia including checking a CBC, a TSH, and a BMP.  Recheck next week if no better or immediately if worse.

## 2015-04-29 LAB — TSH: TSH: 3.231 u[IU]/mL (ref 0.350–4.500)

## 2015-05-01 ENCOUNTER — Ambulatory Visit: Payer: Self-pay | Admitting: Family Medicine

## 2015-05-03 ENCOUNTER — Other Ambulatory Visit: Payer: Self-pay | Admitting: Family Medicine

## 2015-05-03 DIAGNOSIS — R944 Abnormal results of kidney function studies: Secondary | ICD-10-CM

## 2015-05-30 ENCOUNTER — Other Ambulatory Visit: Payer: BLUE CROSS/BLUE SHIELD

## 2015-05-30 DIAGNOSIS — R944 Abnormal results of kidney function studies: Secondary | ICD-10-CM

## 2015-05-30 DIAGNOSIS — E785 Hyperlipidemia, unspecified: Secondary | ICD-10-CM

## 2015-05-30 LAB — BASIC METABOLIC PANEL
BUN: 12 mg/dL (ref 7–25)
CHLORIDE: 101 mmol/L (ref 98–110)
CO2: 28 mmol/L (ref 20–31)
Calcium: 8.9 mg/dL (ref 8.6–10.3)
Creat: 1.26 mg/dL (ref 0.60–1.35)
GLUCOSE: 87 mg/dL (ref 70–99)
POTASSIUM: 4.7 mmol/L (ref 3.5–5.3)
SODIUM: 138 mmol/L (ref 135–146)

## 2015-05-30 LAB — LIPID PANEL
CHOL/HDL RATIO: 4.9 ratio (ref <=5.0)
CHOLESTEROL: 173 mg/dL (ref 125–200)
HDL: 35 mg/dL — ABNORMAL LOW (ref >=40)
LDL CALC: 119 mg/dL (ref <130)
Triglycerides: 94 mg/dL (ref <150)
VLDL: 19 mg/dL (ref <30)

## 2015-05-30 LAB — HEPATIC FUNCTION PANEL
ALT: 24 U/L (ref 9–46)
AST: 24 U/L (ref 10–40)
Albumin: 4.2 g/dL (ref 3.6–5.1)
Alkaline Phosphatase: 47 U/L (ref 40–115)
BILIRUBIN DIRECT: 0.2 mg/dL (ref <=0.2)
BILIRUBIN INDIRECT: 0.7 mg/dL (ref 0.2–1.2)
BILIRUBIN TOTAL: 0.9 mg/dL (ref 0.2–1.2)
Total Protein: 6.5 g/dL (ref 6.1–8.1)

## 2015-05-31 ENCOUNTER — Telehealth: Payer: Self-pay | Admitting: Family Medicine

## 2015-05-31 NOTE — Telephone Encounter (Signed)
PT IS CALLING FOR HIS LAB RESULTS. [phone]

## 2015-05-31 NOTE — Telephone Encounter (Signed)
Pt is aware that [facility] out of office today and will get a call back tomorrow - he would like for [place] to fax over his results. [phone]

## 2015-06-01 NOTE — Telephone Encounter (Signed)
Pt aware of results and labs faxed

## 2016-06-17 ENCOUNTER — Ambulatory Visit (INDEPENDENT_AMBULATORY_CARE_PROVIDER_SITE_OTHER): Payer: BLUE CROSS/BLUE SHIELD | Admitting: Family Medicine

## 2016-06-17 ENCOUNTER — Telehealth: Payer: Self-pay | Admitting: Family Medicine

## 2016-06-17 ENCOUNTER — Encounter: Payer: Self-pay | Admitting: Family Medicine

## 2016-06-17 VITALS — BP 136/80 | HR 80 | Temp 98.1°F | Resp 16 | Wt 227.0 lb

## 2016-06-17 DIAGNOSIS — N200 Calculus of kidney: Secondary | ICD-10-CM | POA: Diagnosis not present

## 2016-06-17 LAB — URINALYSIS, ROUTINE W REFLEX MICROSCOPIC
BILIRUBIN URINE: NEGATIVE
Leukocytes, UA: NEGATIVE
Nitrite: NEGATIVE
PH: 5.5 (ref 5.0–8.0)
Protein, ur: NEGATIVE
SPECIFIC GRAVITY, URINE: 1.025 (ref 1.001–1.035)

## 2016-06-17 LAB — URINALYSIS, MICROSCOPIC ONLY
CASTS: NONE SEEN [LPF]
CRYSTALS: NONE SEEN [HPF]
WBC, UA: NONE SEEN WBC/HPF (ref <=5)
YEAST: NONE SEEN [HPF]

## 2016-06-17 MED ORDER — KETOROLAC TROMETHAMINE 60 MG/2ML IM SOLN
60.0000 mg | Freq: Once | INTRAMUSCULAR | Status: AC
  Administered 2016-06-17: 60 mg via INTRAMUSCULAR

## 2016-06-17 MED ORDER — OXYCODONE-ACETAMINOPHEN 5-325 MG PO TABS: 1.0000 | ORAL_TABLET | ORAL | 0 refills | Status: DC | PRN

## 2016-06-17 MED ORDER — TAMSULOSIN HCL 0.4 MG PO CAPS
0.4000 mg | ORAL_CAPSULE | Freq: Every day | ORAL | 0 refills | Status: DC
Start: 2016-06-17 — End: 2017-04-14

## 2016-06-17 NOTE — Telephone Encounter (Signed)
Pt seen in office

## 2016-06-17 NOTE — Telephone Encounter (Signed)
Patient calling to say he is having pretty bad pain, from possible kidney stone and would like to talk to dr pickard about this asap  [phone]

## 2016-06-17 NOTE — Progress Notes (Signed)
   Subjective:    Patient ID: Brandon Phelps, male    DOB: 1966-04-02, 50 y.o.   MRN: [id number]  HPI Patient is a very pleasant 50 year old white male Presents with the sudden onset of left flank pain. The pain is sharp and intense. It comes and goes in waves. It radiates around towards his bladder. He has trace hematuria. The pain is so intense at times he is nauseated and dry heaving. Past Medical History:  Diagnosis Date  . Abscess    lt thigh   Past Surgical History:  Procedure Laterality Date  . IRRIGATION AND DEBRIDEMENT ABSCESS     lt thigh  . SPINE SURGERY  Cervical Fusion   No current outpatient prescriptions on file prior to visit.   No current facility-administered medications on file prior to visit.    No Known Allergies Social History   Social History  . Marital status: Married    Spouse name: N/A  . Number of children: N/A  . Years of education: N/A   Occupational History  . Not on file.   Social History Main Topics  . Smoking status: Never Smoker  . Smokeless tobacco: Not on file  . Alcohol use No  . Drug use: No  . Sexual activity: Not on file   Other Topics Concern  . Not on file   Social History Narrative  . No narrative on file      Review of Systems  All other systems reviewed and are negative.      Objective:   Physical Exam  Constitutional: He appears well-developed and well-nourished. No distress.  HENT:  Right Ear: External ear normal.  Left Ear: External ear normal.  Nose: Nose normal.  Mouth/Throat: Oropharynx is clear and moist. No oropharyngeal exudate.  Neck: Neck supple. No JVD present. No thyromegaly present.  Cardiovascular: Regular rhythm.  Tachycardia present.  Exam reveals no gallop and no friction rub.   No murmur heard. Pulmonary/Chest: Effort normal and breath sounds normal. No respiratory distress. He has no wheezes. He has no rales. He exhibits no tenderness.  Abdominal: Soft. Bowel sounds are normal.    Musculoskeletal: He exhibits no edema.  Lymphadenopathy:    He has no cervical adenopathy.  Skin: He is not diaphoretic.  Vitals reviewed.         Assessment & Plan:  Kidney stone - Plan: Urinalysis, Routine w reflex microscopic (not at [facility]), oxyCODONE-acetaminophen (ROXICET) 5-325 MG tablet, tamsulosin (FLOMAX) 0.4 MG CAPS capsule  Symptoms are consistent with a kidney stone. I recommended Percocet 5/325 one to 2 tablets every 4 hours as needed. He was given 60 mg of Toradol here. Begin Flomax 0.4 mg daily at bedtime to facilitate stone passage. Seek medical attention immediately should he develop a fever. We will monitor for 24-48 hours. If symptoms are worsening, we will proceed with a CAT scan immediately to determine the size of the stone and to determine if urology consultation as necessary. If symptoms are no better in 48 hours, we will proceed with a CAT scan to facilitate decision-making

## 2016-06-17 NOTE — Addendum Note (Signed)
Addended by: Shary Decamp B on: 06/17/2016 02:23 PM   Modules accepted: Orders

## 2017-04-14 ENCOUNTER — Ambulatory Visit (INDEPENDENT_AMBULATORY_CARE_PROVIDER_SITE_OTHER): Payer: BLUE CROSS/BLUE SHIELD | Admitting: Family Medicine

## 2017-04-14 ENCOUNTER — Encounter: Payer: Self-pay | Admitting: Family Medicine

## 2017-04-14 VITALS — BP 132/94 | HR 68 | Temp 98.3°F | Resp 12 | Ht 73.0 in | Wt 221.0 lb

## 2017-04-14 DIAGNOSIS — M7711 Lateral epicondylitis, right elbow: Secondary | ICD-10-CM

## 2017-04-14 MED ORDER — OMEGA-3-ACID ETHYL ESTERS 1 G PO CAPS: 2.0000 g | ORAL_CAPSULE | Freq: Two times a day (BID) | ORAL | 4 refills | Status: DC

## 2017-04-14 NOTE — Progress Notes (Signed)
   Subjective:    Patient ID: Brandon Phelps, male    DOB: 1966-07-08, 51 y.o.   MRN: [id number]  HPI  Patient reports pain in his right elbow for more than 2 months. Pain is located just distal to the lateral epicondyles. It hurts when he grips objects. It hurts when he tries to dorsiflex his wrist. Patient works as a Software engineer is and is constantly twisting on and off pill bottle caps and he believes that this is irritated his elbow. Past Medical History:  Diagnosis Date  . Abscess    lt thigh   Past Surgical History:  Procedure Laterality Date  . IRRIGATION AND DEBRIDEMENT ABSCESS     lt thigh  . SPINE SURGERY  Cervical Fusion   No current outpatient prescriptions on file prior to visit.   No current facility-administered medications on file prior to visit.    No Known Allergies Social History   Social History  . Marital status: Married    Spouse name: N/A  . Number of children: N/A  . Years of education: N/A   Occupational History  . Not on file.   Social History Main Topics  . Smoking status: Never Smoker  . Smokeless tobacco: Never Used  . Alcohol use No  . Drug use: No  . Sexual activity: Not on file   Other Topics Concern  . Not on file   Social History Narrative  . No narrative on file      Review of Systems  All other systems reviewed and are negative.      Objective:   Physical Exam  Cardiovascular: Normal rate, regular rhythm and normal heart sounds.   Pulmonary/Chest: Effort normal and breath sounds normal.  Musculoskeletal:       Right elbow: He exhibits normal range of motion, no swelling and no effusion. Tenderness found. Lateral epicondyle tenderness noted.  Vitals reviewed.         Assessment & Plan:  Right lateral epicondylitis  We discussed his options. I believe he has lateral epicondylitis. We discussed wearing an elbow strap and a wrist splint to prevent dorsiflexion in addition to taking anti-inflammatories. He states that  he's been wearing a elbow strap and taking ibuprofen without relief. He would like to proceed to a cortisone injection. Therefore using sterile technique, I injected the patient just distal to the lateral epicondyles at the point of maximum tenderness with a mixture of 1/2 mL of lidocaine and 1/2 mL of 40 mg per mL Kenalog. Recommended rest and that the patient wear an elbow strap for an additional 2 weeks after the injection. If not better recheck with me in 2 weeks

## 2017-05-01 ENCOUNTER — Other Ambulatory Visit: Payer: Self-pay | Admitting: Family Medicine

## 2017-05-01 NOTE — Telephone Encounter (Signed)
Ok to refill 

## 2017-05-01 NOTE — Telephone Encounter (Signed)
Ok, can we document what he is using for.  Was given originally for kidney stones.

## 2017-05-01 NOTE — Telephone Encounter (Signed)
Pt is taking for Kidney stones

## 2017-08-06 ENCOUNTER — Other Ambulatory Visit: Payer: Self-pay | Admitting: Family Medicine

## 2018-01-08 DIAGNOSIS — M25562 Pain in left knee: Secondary | ICD-10-CM | POA: Insufficient documentation

## 2018-08-21 ENCOUNTER — Encounter: Payer: Self-pay | Admitting: Family Medicine

## 2018-08-21 ENCOUNTER — Ambulatory Visit: Payer: BLUE CROSS/BLUE SHIELD | Admitting: Family Medicine

## 2018-08-21 VITALS — BP 140/92 | HR 82 | Temp 98.1°F | Resp 16 | Ht 73.0 in | Wt 233.0 lb

## 2018-08-21 DIAGNOSIS — R0789 Other chest pain: Secondary | ICD-10-CM | POA: Diagnosis not present

## 2018-08-21 MED ORDER — ATORVASTATIN CALCIUM 40 MG PO TABS: 40.0000 mg | ORAL_TABLET | Freq: Every day | ORAL | 3 refills | Status: DC

## 2018-08-21 MED ORDER — PANTOPRAZOLE SODIUM 40 MG PO TBEC: 40.0000 mg | DELAYED_RELEASE_TABLET | Freq: Every day | ORAL | 3 refills | Status: AC

## 2018-08-21 MED ORDER — LOSARTAN POTASSIUM 50 MG PO TABS: 50.0000 mg | ORAL_TABLET | Freq: Every day | ORAL | 3 refills | Status: AC

## 2018-08-21 NOTE — Progress Notes (Signed)
Subjective:    Patient ID: Brandon Phelps, male    DOB: 1965-11-14, 52 y.o.   MRN: [id number]  HPI Last night, the patient went to a Christmas party.  He had a large steak, drink 3 beers, and eating quite a bit.  He went home and fell asleep on the couch.  When he woke up to go to bed, he felt a pressure develop in the center of his chest while lying down in bed.  He began to panic concerned that it could be his heart.  He realized that he was anxious and took a Valium that he had at home and fell asleep.  When he woke up the pressure was gone.  He does admit to having more reflux lately.  He is also been having some pain radiate underneath his left shoulder blade.  Otherwise he has been doing well.  Patient states he has had no problems doing physical activity recently.  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  He denies any dyspnea on exertion.  He denies any pleurisy.  He denies any hemoptysis.  He denies any leg swelling.  EKG shows normal sinus rhythm but it does show nonspecific ST changes primarily in the lateral leads.  This is slightly different than his EKG from several years ago.  However there is no overt evidence of ischemia or infarction.  Patient's blood pressure today is elevated at 140/92.  He does have a family history of a mother who had heart attacks in early age although these were due to vasospasms of the coronary arteries.  His uncle also died from a heart attack in his 33s.  Patient has not been compliant with his statin. Past Medical History:  Diagnosis Date  . Abscess    lt thigh   Past Surgical History:  Procedure Laterality Date  . IRRIGATION AND DEBRIDEMENT ABSCESS     lt thigh  . SPINE SURGERY  Cervical Fusion   Current Outpatient Medications on File Prior to Visit  Medication Sig Dispense Refill  . clobetasol (OLUX) 0.05 % topical foam APPLY 1 GRAM ON THE SKIN AS DIRECTED 100 g 2  . omega-3 acid ethyl esters (LOVAZA)  1 g capsule Take 2 capsules (2 g total) by mouth 2 (two) times daily. 360 capsule 4   No current facility-administered medications on file prior to visit.    No Known Allergies Social History   Socioeconomic History  . Marital status: Married    Spouse name: Not on file  . Number of children: Not on file  . Years of education: Not on file  . Highest education level: Not on file  Occupational History  . Not on file  Social Needs  . Financial resource strain: Not on file  . Food insecurity:    Worry: Not on file    Inability: Not on file  . Transportation needs:    Medical: Not on file    Non-medical: Not on file  Tobacco Use  . Smoking status: Never Smoker  . Smokeless tobacco: Never Used  Substance and Sexual Activity  . Alcohol use: No  . Drug use: No  . Sexual activity: Not on file  Lifestyle  . Physical activity:    Days per week: Not on file    Minutes per session: Not on file  . Stress: Not on file  Relationships  . Social connections:    Talks on phone:  Not on file    Gets together: Not on file    Attends religious service: Not on file    Active member of club or organization: Not on file    Attends meetings of clubs or organizations: Not on file    Relationship status: Not on file  . Intimate partner violence:    Fear of current or ex partner: Not on file    Emotionally abused: Not on file    Physically abused: Not on file    Forced sexual activity: Not on file  Other Topics Concern  . Not on file  Social History Narrative  . Not on file    Past Medical History:  Diagnosis Date  . Abscess    lt thigh   Past Surgical History:  Procedure Laterality Date  . IRRIGATION AND DEBRIDEMENT ABSCESS     lt thigh  . SPINE SURGERY  Cervical Fusion   Current Outpatient Medications on File Prior to Visit  Medication Sig Dispense Refill  . clobetasol (OLUX) 0.05 % topical foam APPLY 1 GRAM ON THE SKIN AS DIRECTED 100 g 2  . omega-3 acid ethyl esters (LOVAZA)  1 g capsule Take 2 capsules (2 g total) by mouth 2 (two) times daily. 360 capsule 4  . oxyCODONE-acetaminophen (PERCOCET/ROXICET) 5-325 MG tablet TAKE 1 OR 2 TABLETS BY MOUTH EVERY 4 HOURS AS NEEDED FOR SEVERE PAIN 60 tablet 0   No current facility-administered medications on file prior to visit.    No Known Allergies Social History   Socioeconomic History  . Marital status: Married    Spouse name: Not on file  . Number of children: Not on file  . Years of education: Not on file  . Highest education level: Not on file  Occupational History  . Not on file  Social Needs  . Financial resource strain: Not on file  . Food insecurity:    Worry: Not on file    Inability: Not on file  . Transportation needs:    Medical: Not on file    Non-medical: Not on file  Tobacco Use  . Smoking status: Never Smoker  . Smokeless tobacco: Never Used  Substance and Sexual Activity  . Alcohol use: No  . Drug use: No  . Sexual activity: Not on file  Lifestyle  . Physical activity:    Days per week: Not on file    Minutes per session: Not on file  . Stress: Not on file  Relationships  . Social connections:    Talks on phone: Not on file    Gets together: Not on file    Attends religious service: Not on file    Active member of club or organization: Not on file    Attends meetings of clubs or organizations: Not on file    Relationship status: Not on file  . Intimate partner violence:    Fear of current or ex partner: Not on file    Emotionally abused: Not on file    Physically abused: Not on file    Forced sexual activity: Not on file  Other Topics Concern  . Not on file  Social History Narrative  . Not on file      Review of Systems  All other systems reviewed and are negative.      Objective:   Physical Exam  Constitutional: He appears well-developed and well-nourished. No distress.  HENT:  Right Ear: External ear normal.  Left Ear: External ear normal.  Nose: Nose  normal.    Mouth/Throat: Oropharynx is clear and moist. No oropharyngeal exudate.  Neck: Neck supple. No JVD present. No thyromegaly present.  Cardiovascular: Normal rate, regular rhythm and normal heart sounds. Exam reveals no gallop and no friction rub.  No murmur heard. Pulmonary/Chest: Effort normal and breath sounds normal. No respiratory distress. He has no wheezes. He has no rales. He exhibits no tenderness.  Abdominal: Soft. Bowel sounds are normal. He exhibits no distension. There is no abdominal tenderness. There is no rebound and no guarding.  Musculoskeletal:        General: No edema.  Lymphadenopathy:    He has no cervical adenopathy.  Skin: He is not diaphoretic.  Vitals reviewed.         Assessment & Plan:  Atypical chest pain - Plan: EKG 12-Lead  Given his family history of premature cardiovascular disease, and his history of hyperlipidemia and his borderline blood pressure, I will refer the patient to a cardiology for an outpatient stress test.  However I believe most likely the episode that happened last evening was GI related possibly due to reflux and gas coupled with anxiety.  I will treat the patient's blood pressure with losartan 50 mg a day.  I recommended that he take Lipitor 40 mg a day for his hyperlipidemia.  Until seen by cardiology I would take aspirin 81 mg a day.  Also recommended starting Protonix 40 mg a day as a suspect that he is having acid reflux causing the majority of his symptoms.  Await the results of the cardiology consultation and return fasting for a CMP and fasting lipid panel

## 2018-09-07 NOTE — Progress Notes (Signed)
Cardiology Office Note:    Date:  09/08/2018   ID:  Brandon Phelps, DOB [birth year], MRN [id number]  PCP:  Susy Frizzle, MD  Cardiologist:  Shirlee More, MD   Referring MD: Susy Frizzle, MD  ASSESSMENT:    1. Chest pain in adult   2. Dyslipidemia    PLAN:    In order of problems listed above:  1. After discussion of options and benefits he elects to undergo cardiac CTA for ischemia evaluation.  If high risk markers flow-limiting stenosis would need to consider revascularization.  I counseled him not to expect his calcium score to be 0 and he is aware of the value meaning of the test. 2. Check liver function on a statin recheck lipid profile for efficacy of his current high intensity statin and LP(a). 3. Continue beta-blocker ARB blood pressure at target   Next appointment 4 to 6 weeks   Medication Adjustments/Labs and Tests Ordered: Current medicines are reviewed at length with the patient today.  Concerns regarding medicines are outlined above.  Orders Placed This Encounter  Procedures  . CT CORONARY MORPH W/CTA COR W/SCORE W/CA W/CM &/OR WO/CM  . CT CORONARY FRACTIONAL FLOW RESERVE DATA PREP  . CT CORONARY FRACTIONAL FLOW RESERVE FLUID ANALYSIS  . Basic Metabolic Panel (BMET)  . Comprehensive Metabolic Panel (CMET)  . Lipid Profile  . Lipoprotein A (LPA)   Meds ordered this encounter  Medications  . metoprolol tartrate (LOPRESSOR) 50 MG tablet    Sig: Take 2 tablets (100 mg) by mouth 2 hours before your cardiac CTA.    Dispense:  2 tablet    Refill:  0     Chief Complaint  Patient presents with  . Chest Pain    History of Present Illness:    Brandon Phelps is a 52 y.o. male who is being seen today for the evaluation of chest pain at the request of Susy Frizzle, MD.  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  He has a family history of premature CAD.  He took a Biochemist, clinical out for Christmas to have a large meal at  [employer] and that evening he had an episode at rest where he was unable to take a deep breath he felt quite uncomfortable apprehensive and had fullness in his throat.  It subsided spontaneously after 10 to 15 minutes.  He is concerned about his potential for heart disease especially as he has a persistent discomfort and paresthesia in the left arm and has had cervical spine surgery.  He is not having typical exertional angina dyspnea palpitation or syncope he has no history of congenital or rheumatic heart disease was seen by his PCP and his surface EKG is abnormal with nonspecific ST abnormality.  We discussed methods for an ischemia evaluation reviewed the potential for traditional stress testing with imaging versus cardiac CTA is) family physician advised him to have a cardiac CTA he has no dye allergy and after review of the options and benefits he elects to undergo cardiac CTA as an ischemia evaluation.  He understands that likely he will not have a normal test but if he has high risk findings especially severe flow-limiting stenosis revascularization need to be considered.  He request to recheck a lipid profile along with CMP on a statin and lipoprotein little way with family history. Past Medical History:  Diagnosis Date  . Abscess    lt thigh  . Hyperlipidemia  Past Surgical History:  Procedure Laterality Date  . IRRIGATION AND DEBRIDEMENT ABSCESS     lt thigh  . SPINE SURGERY  Cervical Fusion    Current Medications: Current Meds  Medication Sig  . atorvastatin (LIPITOR) 40 MG tablet Take 1 tablet (40 mg total) by mouth daily.  . clobetasol (OLUX) 0.05 % topical foam APPLY 1 GRAM ON THE SKIN AS DIRECTED  . losartan (COZAAR) 50 MG tablet Take 1 tablet (50 mg total) by mouth daily.  . pantoprazole (PROTONIX) 40 MG tablet Take 1 tablet (40 mg total) by mouth daily.     Allergies:   Patient has no known allergies.   Social History   Socioeconomic History  . Marital status:  Married    Spouse name: Not on file  . Number of children: Not on file  . Years of education: Not on file  . Highest education level: Not on file  Occupational History  . Not on file  Social Needs  . Financial resource strain: Not on file  . Food insecurity:    Worry: Not on file    Inability: Not on file  . Transportation needs:    Medical: Not on file    Non-medical: Not on file  Tobacco Use  . Smoking status: Never Smoker  . Smokeless tobacco: Never Used  Substance and Sexual Activity  . Alcohol use: No  . Drug use: No  . Sexual activity: Not on file  Lifestyle  . Physical activity:    Days per week: Not on file    Minutes per session: Not on file  . Stress: Not on file  Relationships  . Social connections:    Talks on phone: Not on file    Gets together: Not on file    Attends religious service: Not on file    Active member of club or organization: Not on file    Attends meetings of clubs or organizations: Not on file    Relationship status: Not on file  Other Topics Concern  . Not on file  Social History Narrative  . Not on file     Family History: The patient's family history includes Heart attack in his maternal uncle and mother; Hyperlipidemia in his father; Hypertension in his father; Lung cancer in his maternal grandfather.  ROS:   Review of Systems  Constitution: Negative.  HENT: Negative.   Eyes: Negative.   Cardiovascular: Positive for chest pain (fullness in throat with SOb at rest).  Respiratory: Positive for shortness of breath.   Endocrine: Negative.   Hematologic/Lymphatic: Negative.   Skin: Negative.   Musculoskeletal: Positive for neck pain. Negative for joint pain.  Gastrointestinal: Negative.   Genitourinary: Negative.   Neurological: Positive for numbness and paresthesias.  Psychiatric/Behavioral: Negative.   Allergic/Immunologic: Negative.    Please see the history of present illness.     All other systems reviewed and are  negative.  EKGs/Labs/Other Studies Reviewed:    The following studies were reviewed today  EKG 2018/08/26 [facility] minor non specific ST abnormality  Recent Labs: No results found for requested labs within last 8760 hours.  Recent Lipid Panel    Component Value Date/Time   CHOL 173 05/30/2015 0826   TRIG 94 05/30/2015 0826   HDL 35 (L) 05/30/2015 0826   CHOLHDL 4.9 05/30/2015 0826   VLDL 19 05/30/2015 0826   LDLCALC 119 05/30/2015 0826    Physical Exam:    VS:  BP (!) 126/92 (BP Location: Left Arm, Patient  Position: Sitting, Cuff Size: Large)   Pulse 72   Ht 6\' 1"  (1.854 m)   Wt 234 lb (106.1 kg)   SpO2 96%   BMI 30.87 kg/m     Wt Readings from Last 3 Encounters:  09/08/18 234 lb (106.1 kg)  08/21/18 233 lb (105.7 kg)  04/14/17 221 lb (100.2 kg)     GEN:  Well nourished, well developed in no acute distress HEENT: Normal NECK: No JVD; No carotid bruits LYMPHATICS: No lymphadenopathy CARDIAC: RRR, no murmurs, rubs, gallops RESPIRATORY:  Clear to auscultation without rales, wheezing or rhonchi  ABDOMEN: Soft, non-tender, non-distended MUSCULOSKELETAL:  No edema; No deformity  SKIN: Warm and dry NEUROLOGIC:  Alert and oriented x 3 PSYCHIATRIC:  Normal affect     Signed, Shirlee More, MD  09/08/2018 9:33 AM    [place]

## 2018-09-08 ENCOUNTER — Encounter: Payer: Self-pay | Admitting: Cardiology

## 2018-09-08 ENCOUNTER — Ambulatory Visit (INDEPENDENT_AMBULATORY_CARE_PROVIDER_SITE_OTHER): Payer: Managed Care, Other (non HMO) | Admitting: Cardiology

## 2018-09-08 ENCOUNTER — Ambulatory Visit: Payer: Self-pay | Admitting: Cardiology

## 2018-09-08 VITALS — BP 126/92 | HR 72 | Ht 73.0 in | Wt 234.0 lb

## 2018-09-08 DIAGNOSIS — R079 Chest pain, unspecified: Secondary | ICD-10-CM

## 2018-09-08 DIAGNOSIS — E785 Hyperlipidemia, unspecified: Secondary | ICD-10-CM

## 2018-09-08 MED ORDER — METOPROLOL TARTRATE 50 MG PO TABS: ORAL_TABLET | ORAL | 0 refills | Status: DC

## 2018-09-08 NOTE — Patient Instructions (Signed)
Medication Instructions:  Your physician recommends that you continue on your current medications as directed. Please refer to the Current Medication list given to you today.  If you need a refill on your cardiac medications before your next appointment, please call your pharmacy.   Lab work: Your physician recommends that you return for lab work today: CMP, lipid panel, lipoprotein A.   Your physician recommends that you return for lab work within 3-7 days before your cardiac CTA: BMP. You can return to our office for lab work, no appointment needed. No need to fast beforehand.   If you have labs (blood work) drawn today and your tests are completely normal, you will receive your results only by: Marland Kitchen MyChart Message (if you have MyChart) OR . A paper copy in the mail If you have any lab test that is abnormal or we need to change your treatment, we will call you to review the results.  Testing/Procedures: Your physician has requested that you have cardiac CT. Cardiac computed tomography (CT) is a painless test that uses an x-ray machine to take clear, detailed pictures of your heart. For further information please visit [email or web address]. Please follow instruction sheet as given.   Please arrive at the [facility] main entrance of [facility] at xx:xx AM (30-45 minutes prior to test start time)  [facility] [place], [place] [address] [phone]  Proceed to the [facility] Radiology Department (First Floor).  Please follow these instructions carefully (unless otherwise directed):  Hold all erectile dysfunction medications at least 48 hours prior to test.  On the Night Before the Test: . Be sure to Drink plenty of water. . Do not consume any caffeinated/decaffeinated beverages or chocolate 12 hours prior to your test. . Do not take any antihistamines 12 hours prior to your test.  On the Day of the Test: . Drink plenty of water. Do not drink  any water within one hour of the test. . Do not eat any food 4 hours prior to the test. . You may take your regular medications prior to the test.  . Take metoprolol (Lopressor) two hours prior to test.                  -If HR is less than 55 BPM- No Beta Blocker                -IF HR is greater than 55 BPM and patient is less than or equal to 46 yrs old Lopressor 100mg  x1.                -If HR is greater than 55 BPM and patient is greater than 88 yrs old Lopressor 50 mg x1.         After the Test: . Drink plenty of water. . After receiving IV contrast, you may experience a mild flushed feeling. This is normal. . On occasion, you may experience a mild rash up to 24 hours after the test. This is not dangerous. If this occurs, you can take Benadryl 25 mg and increase your fluid intake. . If you experience trouble breathing, this can be serious. If it is severe call 911 IMMEDIATELY. If it is mild, please call our office.   Follow-Up: At [facility], you and your health needs are our priority.  As part of our continuing mission to provide you with exceptional heart care, we have created designated Provider Care Teams.  These Care Teams include  your primary Cardiologist (physician) and Advanced Practice Providers (APPs -  Physician Assistants and Nurse Practitioners) who all work together to provide you with the care you need, when you need it. You will need a follow up appointment in 6 weeks.        Cardiac CT Angiogram  A cardiac CT angiogram is a procedure to look at the heart and the area around the heart. It may be done to help find the cause of chest pains or other symptoms of heart disease. During this procedure, a large X-ray machine, called a CT scanner, takes detailed pictures of the heart and the surrounding area after a dye (contrast material) has been injected into blood vessels in the area. The procedure is also sometimes called a coronary CT angiogram, coronary artery  scanning, or CTA. A cardiac CT angiogram allows the health care provider to see how well blood is flowing to and from the heart. The health care provider will be able to see if there are any problems, such as:  Blockage or narrowing of the coronary arteries in the heart.  Fluid around the heart.  Signs of weakness or disease in the muscles, valves, and tissues of the heart. Tell a health care provider about:  Any allergies you have. This is especially important if you have had a previous allergic reaction to contrast dye.  All medicines you are taking, including vitamins, herbs, eye drops, creams, and over-the-counter medicines.  Any blood disorders you have.  Any surgeries you have had.  Any medical conditions you have.  Whether you are pregnant or may be pregnant.  Any anxiety disorders, chronic pain, or other conditions you have that may increase your stress or prevent you from lying still. What are the risks? Generally, this is a safe procedure. However, problems may occur, including:  Bleeding.  Infection.  Allergic reactions to medicines or dyes.  Damage to other structures or organs.  Kidney damage from the dye or contrast that is used.  Increased risk of cancer from radiation exposure. This risk is low. Talk with your health care provider about: ? The risks and benefits of testing. ? How you can receive the lowest dose of radiation. What happens before the procedure?  Wear comfortable clothing and remove any jewelry, glasses, dentures, and hearing aids.  Follow instructions from your health care provider about eating and drinking. This may include: ? For 12 hours before the test - avoid caffeine. This includes tea, coffee, soda, energy drinks, and diet pills. Drink plenty of water or other fluids that do not have caffeine in them. Being well-hydrated can prevent complications. ? For 4-6 hours before the test - stop eating and drinking. The contrast dye can cause  nausea, but this is less likely if your stomach is empty.  Ask your health care provider about changing or stopping your regular medicines. This is especially important if you are taking diabetes medicines, blood thinners, or medicines to treat erectile dysfunction. What happens during the procedure?  Hair on your chest may need to be removed so that small sticky patches called electrodes can be placed on your chest. These will transmit information that helps to monitor your heart during the test.  An IV tube will be inserted into one of your veins.  You might be given a medicine to control your heart rate during the test. This will help to ensure that good images are obtained.  You will be asked to lie on an exam table. This  table will slide in and out of the CT machine during the procedure.  Contrast dye will be injected into the IV tube. You might feel warm, or you may get a metallic taste in your mouth.  You will be given a medicine (nitroglycerin) to relax (dilate) the arteries in your heart.  The table that you are lying on will move into the CT machine tunnel for the scan.  The person running the machine will give you instructions while the scans are being done. You may be asked to: ? Keep your arms above your head. ? Hold your breath. ? Stay very still, even if the table is moving.  When the scanning is complete, you will be moved out of the machine.  The IV tube will be removed. The procedure may vary among health care providers and hospitals. What happens after the procedure?  You might feel warm, or you may get a metallic taste in your mouth from the contrast dye.  You may have a headache from the nitroglycerin.  After the procedure, drink water or other fluids to wash (flush) the contrast material out of your body.  Contact a health care provider if you have any symptoms of allergy to the contrast. These symptoms include: ? Shortness of breath. ? Rash or hives. ? A  racing heartbeat.  Most people can return to their normal activities right after the procedure. Ask your health care provider what activities are safe for you.  It is up to you to get the results of your procedure. Ask your health care provider, or the department that is doing the procedure, when your results will be ready. Summary  A cardiac CT angiogram is a procedure to look at the heart and the area around the heart. It may be done to help find the cause of chest pains or other symptoms of heart disease.  During this procedure, a large X-ray machine, called a CT scanner, takes detailed pictures of the heart and the surrounding area after a dye (contrast material) has been injected into blood vessels in the area.  Ask your health care provider about changing or stopping your regular medicines before the procedure. This is especially important if you are taking diabetes medicines, blood thinners, or medicines to treat erectile dysfunction.  After the procedure, drink water or other fluids to wash (flush) the contrast material out of your body. This information is not intended to replace advice given to you by your health care provider. Make sure you discuss any questions you have with your health care provider. Document Released: 08/08/2008 Document Revised: 07/15/2016 Document Reviewed: 07/15/2016 Elsevier Interactive Patient Education  2019 [employer].

## 2018-09-10 LAB — COMPREHENSIVE METABOLIC PANEL
ALT: 34 IU/L (ref 0–44)
AST: 31 IU/L (ref 0–40)
Albumin/Globulin Ratio: 2.2 (ref 1.2–2.2)
Albumin: 4.4 g/dL (ref 3.5–5.5)
Alkaline Phosphatase: 53 IU/L (ref 39–117)
BILIRUBIN TOTAL: 0.7 mg/dL (ref 0.0–1.2)
BUN/Creatinine Ratio: 8 — ABNORMAL LOW (ref 9–20)
BUN: 11 mg/dL (ref 6–24)
CHLORIDE: 104 mmol/L (ref 96–106)
CO2: 24 mmol/L (ref 20–29)
CREATININE: 1.34 mg/dL — AB (ref 0.76–1.27)
Calcium: 9.2 mg/dL (ref 8.7–10.2)
GFR calc non Af Amer: 61 mL/min/{1.73_m2} (ref >59)
GFR, EST AFRICAN AMERICAN: 71 mL/min/{1.73_m2} (ref >59)
GLUCOSE: 104 mg/dL — AB (ref 65–99)
Globulin, Total: 2 g/dL (ref 1.5–4.5)
Potassium: 4.5 mmol/L (ref 3.5–5.2)
Sodium: 144 mmol/L (ref 134–144)
TOTAL PROTEIN: 6.4 g/dL (ref 6.0–8.5)

## 2018-09-10 LAB — LIPID PANEL
CHOLESTEROL TOTAL: 135 mg/dL (ref 100–199)
Chol/HDL Ratio: 3.1 ratio (ref 0.0–5.0)
HDL: 43 mg/dL (ref >39)
LDL Calculated: 80 mg/dL (ref 0–99)
Triglycerides: 58 mg/dL (ref 0–149)
VLDL CHOLESTEROL CAL: 12 mg/dL (ref 5–40)

## 2018-09-10 LAB — LIPOPROTEIN A (LPA): Lipoprotein (a): 18.9 nmol/L (ref <75.0)

## 2018-09-11 ENCOUNTER — Ambulatory Visit: Payer: Self-pay | Admitting: Internal Medicine

## 2018-09-18 ENCOUNTER — Ambulatory Visit: Payer: Self-pay | Admitting: Cardiovascular Disease

## 2018-09-21 ENCOUNTER — Encounter (HOSPITAL_COMMUNITY): Payer: Self-pay | Admitting: Emergency Medicine

## 2018-09-21 ENCOUNTER — Other Ambulatory Visit: Payer: Self-pay

## 2018-09-21 ENCOUNTER — Emergency Department (HOSPITAL_COMMUNITY): Payer: Managed Care, Other (non HMO)

## 2018-09-21 ENCOUNTER — Emergency Department (HOSPITAL_COMMUNITY)
Admission: EM | Admit: 2018-09-21 | Discharge: 2018-09-21 | Disposition: A | Discharge status: Home / Self Care | Payer: Managed Care, Other (non HMO) | Attending: Emergency Medicine | Admitting: Emergency Medicine

## 2018-09-21 DIAGNOSIS — R079 Chest pain, unspecified: Secondary | ICD-10-CM

## 2018-09-21 DIAGNOSIS — Z79899 Other long term (current) drug therapy: Secondary | ICD-10-CM | POA: Diagnosis not present

## 2018-09-21 DIAGNOSIS — R0789 Other chest pain: Secondary | ICD-10-CM | POA: Insufficient documentation

## 2018-09-21 DIAGNOSIS — I10 Essential (primary) hypertension: Secondary | ICD-10-CM | POA: Diagnosis not present

## 2018-09-21 DIAGNOSIS — Z7982 Long term (current) use of aspirin: Secondary | ICD-10-CM | POA: Diagnosis not present

## 2018-09-21 DIAGNOSIS — R0602 Shortness of breath: Secondary | ICD-10-CM | POA: Insufficient documentation

## 2018-09-21 LAB — BASIC METABOLIC PANEL
Anion gap: 8 (ref 5–15)
BUN: 11 mg/dL (ref 6–20)
CO2: 26 mmol/L (ref 22–32)
Calcium: 9.3 mg/dL (ref 8.9–10.3)
Chloride: 103 mmol/L (ref 98–111)
Creatinine, Ser: 1.38 mg/dL — ABNORMAL HIGH (ref 0.61–1.24)
GFR calc Af Amer: >60 mL/min (ref >60)
GFR calc non Af Amer: 59 mL/min — ABNORMAL LOW (ref >60)
GLUCOSE: 113 mg/dL — AB (ref 70–99)
Potassium: 4.3 mmol/L (ref 3.5–5.1)
Sodium: 137 mmol/L (ref 135–145)

## 2018-09-21 LAB — CBC
HCT: 58.1 % — ABNORMAL HIGH (ref 39.0–52.0)
Hemoglobin: 19.1 g/dL — ABNORMAL HIGH (ref 13.0–17.0)
MCH: 30.6 pg (ref 26.0–34.0)
MCHC: 32.9 g/dL (ref 30.0–36.0)
MCV: 93 fL (ref 80.0–100.0)
Platelets: 260 K/uL (ref 150–400)
RBC: 6.25 MIL/uL — ABNORMAL HIGH (ref 4.22–5.81)
RDW: 13 % (ref 11.5–15.5)
WBC: 8.4 K/uL (ref 4.0–10.5)
nRBC: 0 % (ref 0.0–0.2)

## 2018-09-21 LAB — I-STAT TROPONIN, ED
Troponin i, poc: 0 ng/mL (ref 0.00–0.08)
Troponin i, poc: 0.01 ng/mL (ref 0.00–0.08)

## 2018-09-21 LAB — D-DIMER, QUANTITATIVE: D-Dimer, Quant: <0.27 ug{FEU}/mL (ref 0.00–0.50)

## 2018-09-21 MED ORDER — SODIUM CHLORIDE 0.9 % IV BOLUS
1000.0000 mL | Freq: Once | INTRAVENOUS | Status: AC
  Administered 2018-09-21: 1000 mL via INTRAVENOUS

## 2018-09-21 NOTE — ED Provider Notes (Signed)
[facility] Provider Note   CSN: [id number] Arrival date & time: 09/21/18  1019     History   Chief Complaint Chief Complaint  Patient presents with  . Chest Pain    HPI Brandon [facility] is a 53 y.o. male.  [facility] is a 53 y.o. male with history of hypertension, hyperlipidemia who presents to the emergency department for evaluation of left-sided chest pain.  He reports pain started this morning after arriving at work.  He reports its a dull pressure-like sensation over the left side of the chest.  He denies any radiation to the arm neck or jaw, does report some occasional paresthesias in the left hand, but reports this can happen occasionally ever since his C-spine surgery.  He reports he does feel short of breath but denies pleuritic chest pain.  Pain is not worse with exertion.  No associated diaphoresis, nausea or vomiting.  No lightheadedness or syncope.  He did take 325 of aspirin prior to arrival, no other medications to treat the symptoms.  Patient does report that symptoms started about an hour after he drink caffeinated tea, reports this does not typically bother him but it did take stronger than usual this morning.  Did note that his apple watch read with an elevated heart rate associated with the symptoms today, and he does report that he thinks since noting this he has been more anxious and this may be contributing.  Patient had one previous episode of chest pain and was recently seen by Dr. Bettina Gavia with cardiology and has a cardiac CTA scheduled for the 29th of this month.  Patient does have cardiac risk factors and hypertension, mild hyperlipidemia, recently started on Lipitor preventatively, and does have significant family history of multiple family members with early cardiac disease.  Patient denies any smoking history.  He is not diabetic. Denies lower extremity pain or swelling, recent travel or immobilization, history of PE or DVT,  family or personal history of bleeding or clotting disorders, cough or hemoptysis.     Past Medical History:  Diagnosis Date  . Abscess    lt thigh  . Hyperlipidemia   . Hypertension     Patient Active Problem List   Diagnosis Date Noted  . HLD (hyperlipidemia) 05/30/2015  . Abscess of left thigh 09/06/2011    Past Surgical History:  Procedure Laterality Date  . IRRIGATION AND DEBRIDEMENT ABSCESS     lt thigh  . SPINE SURGERY  Cervical Fusion        Home Medications    Prior to Admission medications   Medication Sig Start Date End Date Taking? Authorizing Provider  aspirin EC 325 MG tablet Take 325 mg by mouth daily.   Yes [provider]  atorvastatin (LIPITOR) 40 MG tablet Take 1 tablet (40 mg total) by mouth daily. 08/21/18  Yes Susy Frizzle, MD  clobetasol (OLUX) 0.05 % topical foam APPLY 1 GRAM ON THE SKIN AS DIRECTED Patient taking differently: Apply 1 g topically as directed. On the scalp 08/06/17  Yes Pickard, Cammie Mcgee, MD  losartan (COZAAR) 50 MG tablet Take 1 tablet (50 mg total) by mouth daily. 08/21/18  Yes Susy Frizzle, MD  pantoprazole (PROTONIX) 40 MG tablet Take 1 tablet (40 mg total) by mouth daily. 08/21/18  Yes Susy Frizzle, MD  metoprolol tartrate (LOPRESSOR) 50 MG tablet Take 2 tablets (100 mg) by mouth 2 hours before your cardiac CTA. 09/08/18   Shirlee More  J, MD    Family History Family History  Problem Relation Age of Onset  . Heart attack Mother   . Hypertension Father   . Hyperlipidemia Father   . Heart attack Maternal Uncle   . Lung cancer Maternal Grandfather     Social History Social History   Tobacco Use  . Smoking status: Never Smoker  . Smokeless tobacco: Never Used  Substance Use Topics  . Alcohol use: No  . Drug use: No     Allergies   Patient has no known allergies.   Review of Systems Review of Systems  Constitutional: Negative for activity change, appetite change, chills, diaphoresis  and fever.  Eyes: Negative for visual disturbance.  Respiratory: Positive for shortness of breath. Negative for cough, chest tightness and wheezing.   Cardiovascular: Positive for chest pain. Negative for palpitations and leg swelling.  Gastrointestinal: Negative for abdominal pain, nausea and vomiting.  Musculoskeletal: Negative for arthralgias and joint swelling.  Skin: Negative for color change and rash.  Neurological: Negative for dizziness, syncope, light-headedness and headaches.  All other systems reviewed and are negative.    Physical Exam Updated Vital Signs BP (!) 147/94 (BP Location: Right Arm)   Pulse (!) 113   Temp 97.9 F (36.6 C) (Oral)   Resp 18   SpO2 94%   Physical Exam Vitals signs and nursing note reviewed.  Constitutional:      General: He is not in acute distress.    Appearance: He is well-developed and normal weight. He is not ill-appearing or diaphoretic.  HENT:     Head: Normocephalic and atraumatic.  Eyes:     General:        Right eye: No discharge.        Left eye: No discharge.     Pupils: Pupils are equal, round, and reactive to light.  Neck:     Musculoskeletal: Neck supple.     Vascular: No JVD.     Trachea: No tracheal deviation.  Cardiovascular:     Rate and Rhythm: Regular rhythm. Tachycardia present.     Pulses:          Radial pulses are 2+ on the right side and 2+ on the left side.       Dorsalis pedis pulses are 2+ on the right side and 2+ on the left side.     Heart sounds: Normal heart sounds. No murmur. No friction rub. No gallop.   Pulmonary:     Effort: Pulmonary effort is normal. No respiratory distress.     Breath sounds: Normal breath sounds. No wheezing or rales.     Comments: Respirations equal and unlabored, patient able to speak in full sentences, lungs clear to auscultation bilaterally Chest:     Chest wall: No tenderness.  Abdominal:     General: Bowel sounds are normal. There is no distension.     Palpations:  Abdomen is soft. There is no mass.     Tenderness: There is no abdominal tenderness. There is no guarding.  Musculoskeletal:        General: No deformity.     Right lower leg: He exhibits no tenderness. No edema.     Left lower leg: He exhibits no tenderness. No edema.  Skin:    General: Skin is warm and dry.     Capillary Refill: Capillary refill takes less than 2 seconds.  Neurological:     General: No focal deficit present.     Mental Status: He  is alert and oriented to person, place, and time.     Coordination: Coordination normal.     Comments: Speech is clear, able to follow commands CN III-XII intact Normal strength in upper and lower extremities bilaterally including dorsiflexion and plantar flexion, strong and equal grip strength Sensation normal to light and sharp touch Moves extremities without ataxia, coordination intact  Psychiatric:        Mood and Affect: Mood normal.        Behavior: Behavior normal.      ED Treatments / Results  Labs (all labs ordered are listed, but only abnormal results are displayed) Labs Reviewed  BASIC METABOLIC PANEL - Abnormal; Notable for the following components:      Result Value   Glucose, Bld 113 (*)    Creatinine, Ser 1.38 (*)    GFR calc non Af Amer 59 (*)    All other components within normal limits  CBC - Abnormal; Notable for the following components:   RBC 6.25 (*)    Hemoglobin 19.1 (*)    HCT 58.1 (*)    All other components within normal limits  D-DIMER, QUANTITATIVE (NOT AT [facility])  I-STAT TROPONIN, ED  I-STAT TROPONIN, ED    EKG EKG Interpretation  Date/Time:  Monday September 21 2018 10:24:39 EST Ventricular Rate:  112 PR Interval:  146 QRS Duration: 90 QT Interval:  300 QTC Calculation: 409 R Axis:   26 Text Interpretation:  Sinus tachycardia Septal infarct , age undetermined Abnormal ECG No significant change since last tracing Confirmed by Dorie Rank [phone]) on 09/21/2018 11:24:35 AM   Radiology Dg  Chest 2 View  Result Date: 09/21/2018 CLINICAL DATA:  Chest pressure.  No shortness of breath. EXAM: CHEST - 2 VIEW COMPARISON:  No recent prior. FINDINGS: Mediastinum and hilar structures normal. Low lung volumes with mild bibasilar atelectasis. No pleural effusion or pneumothorax. Heart size normal. Thoracic spine fusion. IMPRESSION: No acute cardiopulmonary disease. Electronically Signed   By: Marcello Moores  Register   On: 09/21/2018 11:39    Procedures Procedures (including critical care time)  Medications Ordered in ED Medications  sodium chloride 0.9 % bolus 1,000 mL (0 mLs Intravenous Stopped 09/21/18 1410)     Initial Impression / Assessment and Plan / ED Course  I have reviewed the triage vital signs and the nursing notes.  Pertinent labs & imaging results that were available during my care of the patient were reviewed by me and considered in my medical decision making (see chart for details).  Presents to the emergency department for episode of left-sided chest pain that started today, upon arriving to the department chest pain seemed to significantly improve.  He did take 325 of aspirin prior to arrival, nothing else to treat his symptoms.  One prior episode of chest pain and was recently seen by cardiology, has cardiac CTA scheduled at the end of this month.  Risk factors for ACS include hypertension, mild hyperlipidemia and family history.  Patient also noted to be tachycardic on arrival, no prior history of DVT or PE and patient is not feeling short of breath but O2 sats remaining around 93%, will get d-dimer to rule out PE.  Symptoms do not seem consistent with aortic dissection.  Patient is not having any cough or fever to suggest pneumonia.  Will check basic labs, troponin, EKG, chest x-ray and d-dimer.  Will give IV fluids for tachycardia.  Patient did have caffeinated tea prior to arrival and I wonder if he could  be having some palpitations due to high level of caffeine that are causing  symptoms as well.  EKG with no significant changes since recent evaluation with cardiology and initial troponin is negative.  Patient does have an elevated hemoglobin of 19.1, on chart review patient has history of similarly elevated hemoglobins in the past, he reports he has never been evaluated by hematologist, patient does report that he frequently is red in the face and wonders if this could contribute to that also is frequently itchy after showers.  Will refer to hematology for outpatient evaluation of polycythemia.  No leukocytosis, no electrolytes requiring intervention.  Creatinine slightly elevated at 1.38 but this appears to be patient's baseline.  Chest x-ray with no active cardiopulmonary disease.  D-dimer is not elevated, patient ruled out for PE.  Delta troponin collected and is negative.  At this time I feel patient is stable for discharge home with low risk for acute cardiac event.  He has a cardiac CTA scheduled later this month and close follow-up with cardiologist.  I have discussed appropriate return precautions.  Patient expresses understanding and agreement with plan.  Stable for discharge home at this time.  Final Clinical Impressions(s) / ED Diagnoses   Final diagnoses:  Left-sided chest pain    ED Discharge Orders         Ordered    Ambulatory referral to Hematology / Oncology    Comments:  Polycythemia   09/21/18 1535           Benedetto Goad [place], [place] 09/21/18 1537    Dorie Rank, MD 09/23/18 [phone]

## 2018-09-21 NOTE — ED Notes (Signed)
Patient verbalizes understanding of discharge instructions. Opportunity for questioning and answers were provided. Armband removed by staff, pt discharged from ED ambulatory.   

## 2018-09-21 NOTE — ED Notes (Signed)
Patient transported to X-ray 

## 2018-09-21 NOTE — Discharge Instructions (Addendum)
Your work-up today was very reassuring and does not suggest an acute problem with your heart or lungs causing your chest pain.  I would like for you to follow-up closely with your cardiologist and continue with the plan for cardiac CTA later this month.  If you have repeat episodes of chest pain, any shortness of breath if you feel lightheaded or like you may pass out or any other new or concerning symptoms occur please return to the emergency department for reevaluation.  Follow-up with hematology regarding elevated red blood cell count.

## 2018-09-21 NOTE — ED Triage Notes (Signed)
Pt here from home  With chest pain tachycardia , no n/v some slight sob

## 2018-09-22 ENCOUNTER — Telehealth: Payer: Self-pay | Admitting: Internal Medicine

## 2018-09-22 ENCOUNTER — Encounter: Payer: Self-pay | Admitting: Internal Medicine

## 2018-09-22 NOTE — Telephone Encounter (Signed)
A hematology appt has been scheduled for the pt to see Dr. Walden Field on 1/31 at 1pm. Pt aware to arrive 30 minutes early. Letter mailed.

## 2018-10-03 LAB — BASIC METABOLIC PANEL
BUN / CREAT RATIO: 10 (ref 9–20)
BUN: 14 mg/dL (ref 6–24)
CO2: 24 mmol/L (ref 20–29)
Calcium: 9.2 mg/dL (ref 8.7–10.2)
Chloride: 100 mmol/L (ref 96–106)
Creatinine, Ser: 1.39 mg/dL — ABNORMAL HIGH (ref 0.76–1.27)
GFR calc Af Amer: 68 mL/min/{1.73_m2} (ref >59)
GFR, EST NON AFRICAN AMERICAN: 59 mL/min/{1.73_m2} — AB (ref >59)
Glucose: 106 mg/dL — ABNORMAL HIGH (ref 65–99)
POTASSIUM: 5 mmol/L (ref 3.5–5.2)
SODIUM: 142 mmol/L (ref 134–144)

## 2018-10-05 ENCOUNTER — Telehealth (HOSPITAL_COMMUNITY): Payer: Self-pay | Admitting: Emergency Medicine

## 2018-10-05 NOTE — Telephone Encounter (Signed)
Reaching out to patient to offer assistance regarding upcoming cardiac imaging study; pt verbalizes understanding of appt date/time, parking situation and where to check in, pre-test NPO status and medications ordered, and verified current allergies; name and call back number provided for further questions should they arise Kollen Armenti RN Navigator Cardiac Imaging [facility] [phone] office [phone] cell 

## 2018-10-07 ENCOUNTER — Ambulatory Visit (HOSPITAL_COMMUNITY)
Admission: RE | Admit: 2018-10-07 | Discharge: 2018-10-07 | Disposition: A | Discharge status: Home / Self Care | Payer: Managed Care, Other (non HMO) | Source: Ambulatory Visit | Attending: Cardiology | Admitting: Cardiology

## 2018-10-07 ENCOUNTER — Ambulatory Visit (HOSPITAL_COMMUNITY): Admission: RE | Admit: 2018-10-07 | Payer: Managed Care, Other (non HMO) | Source: Ambulatory Visit

## 2018-10-07 DIAGNOSIS — R079 Chest pain, unspecified: Secondary | ICD-10-CM

## 2018-10-07 MED ORDER — NITROGLYCERIN 0.4 MG SL SUBL
0.8000 mg | SUBLINGUAL_TABLET | Freq: Once | SUBLINGUAL | Status: AC
  Administered 2018-10-07: 0.8 mg via SUBLINGUAL
  Filled 2018-10-07: qty 25

## 2018-10-07 MED ORDER — IOPAMIDOL (ISOVUE-370) INJECTION 76%
80.0000 mL | Freq: Once | INTRAVENOUS | Status: AC | PRN
  Administered 2018-10-07: 80 mL via INTRAVENOUS

## 2018-10-07 MED ORDER — NITROGLYCERIN 0.4 MG SL SUBL
SUBLINGUAL_TABLET | SUBLINGUAL | Status: AC
  Filled 2018-10-07: qty 2

## 2018-10-09 ENCOUNTER — Inpatient Hospital Stay: Payer: Managed Care, Other (non HMO)

## 2018-10-09 ENCOUNTER — Telehealth: Payer: Self-pay | Admitting: *Deleted

## 2018-10-09 ENCOUNTER — Inpatient Hospital Stay: Payer: Managed Care, Other (non HMO) | Attending: Internal Medicine | Admitting: Internal Medicine

## 2018-10-09 ENCOUNTER — Encounter: Payer: Self-pay | Admitting: Internal Medicine

## 2018-10-09 VITALS — BP 144/91 | HR 73 | Temp 98.0°F | Resp 18 | Ht 73.0 in | Wt 233.4 lb

## 2018-10-09 DIAGNOSIS — L539 Erythematous condition, unspecified: Secondary | ICD-10-CM

## 2018-10-09 DIAGNOSIS — D751 Secondary polycythemia: Secondary | ICD-10-CM

## 2018-10-09 DIAGNOSIS — R079 Chest pain, unspecified: Secondary | ICD-10-CM

## 2018-10-09 DIAGNOSIS — I10 Essential (primary) hypertension: Secondary | ICD-10-CM

## 2018-10-09 DIAGNOSIS — Z7982 Long term (current) use of aspirin: Secondary | ICD-10-CM | POA: Insufficient documentation

## 2018-10-09 DIAGNOSIS — Z801 Family history of malignant neoplasm of trachea, bronchus and lung: Secondary | ICD-10-CM | POA: Diagnosis not present

## 2018-10-09 DIAGNOSIS — N289 Disorder of kidney and ureter, unspecified: Secondary | ICD-10-CM

## 2018-10-09 DIAGNOSIS — R102 Pelvic and perineal pain: Secondary | ICD-10-CM

## 2018-10-09 DIAGNOSIS — M25559 Pain in unspecified hip: Secondary | ICD-10-CM

## 2018-10-09 DIAGNOSIS — M79659 Pain in unspecified thigh: Secondary | ICD-10-CM

## 2018-10-09 LAB — CBC WITH DIFFERENTIAL/PLATELET
Abs Immature Granulocytes: 0.02 10*3/uL (ref 0.00–0.07)
BASOS PCT: 1 %
Basophils Absolute: 0.1 10*3/uL (ref 0.0–0.1)
Eosinophils Absolute: 0.1 10*3/uL (ref 0.0–0.5)
Eosinophils Relative: 1 %
HCT: 58.2 % — ABNORMAL HIGH (ref 39.0–52.0)
Hemoglobin: 19.3 g/dL — ABNORMAL HIGH (ref 13.0–17.0)
Immature Granulocytes: 0 %
Lymphocytes Relative: 25 %
Lymphs Abs: 1.9 10*3/uL (ref 0.7–4.0)
MCH: 30.3 pg (ref 26.0–34.0)
MCHC: 33.2 g/dL (ref 30.0–36.0)
MCV: 91.4 fL (ref 80.0–100.0)
Monocytes Absolute: 0.7 10*3/uL (ref 0.1–1.0)
Monocytes Relative: 9 %
Neutro Abs: 5 10*3/uL (ref 1.7–7.7)
Neutrophils Relative %: 64 %
Platelets: 233 10*3/uL (ref 150–400)
RBC: 6.37 MIL/uL — ABNORMAL HIGH (ref 4.22–5.81)
RDW: 12.9 % (ref 11.5–15.5)
WBC: 7.8 10*3/uL (ref 4.0–10.5)
nRBC: 0 % (ref 0.0–0.2)

## 2018-10-09 LAB — SEDIMENTATION RATE: Sed Rate: 0 mm/hr (ref 0–16)

## 2018-10-09 LAB — COMPREHENSIVE METABOLIC PANEL
ALT: 44 U/L (ref 0–44)
AST: 33 U/L (ref 15–41)
Albumin: 4.6 g/dL (ref 3.5–5.0)
Alkaline Phosphatase: 62 U/L (ref 38–126)
Anion gap: 10 (ref 5–15)
BUN: 12 mg/dL (ref 6–20)
CO2: 30 mmol/L (ref 22–32)
Calcium: 9.8 mg/dL (ref 8.9–10.3)
Chloride: 102 mmol/L (ref 98–111)
Creatinine, Ser: 1.39 mg/dL — ABNORMAL HIGH (ref 0.61–1.24)
GFR calc non Af Amer: 59 mL/min — ABNORMAL LOW (ref >60)
Glucose, Bld: 76 mg/dL (ref 70–99)
Potassium: 4.2 mmol/L (ref 3.5–5.1)
Sodium: 142 mmol/L (ref 135–145)
Total Bilirubin: 1.2 mg/dL (ref 0.3–1.2)
Total Protein: 7.9 g/dL (ref 6.5–8.1)

## 2018-10-09 LAB — IRON AND TIBC
IRON: 129 ug/dL (ref 42–163)
Saturation Ratios: 32 % (ref 20–55)
TIBC: 400 ug/dL (ref 202–409)
UIBC: 271 ug/dL (ref 117–376)

## 2018-10-09 LAB — LACTATE DEHYDROGENASE: LDH: 195 U/L — ABNORMAL HIGH (ref 98–192)

## 2018-10-09 LAB — C-REACTIVE PROTEIN: CRP: <0.8 mg/dL (ref <1.0)

## 2018-10-09 LAB — FERRITIN: FERRITIN: 248 ng/mL (ref 24–336)

## 2018-10-09 MED ORDER — NITROGLYCERIN 0.4 MG SL SUBL: 0.4000 mg | SUBLINGUAL_TABLET | SUBLINGUAL | 1 refills | Status: DC | PRN

## 2018-10-09 NOTE — Telephone Encounter (Signed)
-----   Message from Richardo Priest, MD sent at 10/09/2018 10:40 AM EST ----- Normal coronary arteries score of 0 but has a bridge segment with artery within not on muscle usually a curiosity but at times causes angina. He should have NTG 1/150 grain and if recurrent can add oral nitrate. I do not feel he is at risk of MI. Also MILD enlargement of aorta important to take BP meds and recheck non contrast CT 6 months usually not progressive. Overall a good report

## 2018-10-09 NOTE — Progress Notes (Signed)
Referring Physician:  Dr. Margaretmary Eddy and ER physicians  Diagnosis Polycythemia, secondary - Plan: CBC with Differential/Platelet, Comprehensive metabolic panel, Lactate dehydrogenase, Protein electrophoresis, serum, Ferritin, Iron and TIBC, Transferrin Saturation, Hepatitis B core antibody, total, Hepatitis B surface antibody,qualitative, Hepatitis B surface antigen, Hepatitis C RNA quantitative, Hepatitis panel, acute, HIV Antibody (routine testing w rflx), BCR-ABL1 FISH, JAK2 V617F, w Reflex to CALR/E12/MPL, Hemochromatosis DNA-PCR(c282y,h63d), Sedimentation rate, C-reactive protein, ANA, IFA (with reflex), Rheumatoid factor, Erythropoietin  Pain in joint involving pelvic region and thigh, unspecified laterality - Plan: CBC with Differential/Platelet, Comprehensive metabolic panel, Lactate dehydrogenase, Protein electrophoresis, serum, Ferritin, Iron and TIBC, Transferrin Saturation, Hepatitis B core antibody, total, Hepatitis B surface antibody,qualitative, Hepatitis B surface antigen, Hepatitis C RNA quantitative, Hepatitis panel, acute, HIV Antibody (routine testing w rflx), BCR-ABL1 FISH, JAK2 V617F, w Reflex to CALR/E12/MPL, Hemochromatosis DNA-PCR(c282y,h63d), Sedimentation rate, C-reactive protein, ANA, IFA (with reflex), Rheumatoid factor, Erythropoietin  Staging Cancer Staging No matching staging information was found for the patient.  Assessment and Plan:  1.   Polycythemia.  53 year old male who reports long history of facial erythema.  He reports he is followed by dermatology and denies any skin cancer history or rashes.  He uses sunscreen.  He reports his dad is also affected by facial erythema as well.  He denies smoking.  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  It subsided spontaneously after 10 to 15  minutes.  He is concerned about his potential for heart disease especially as he has a persistent discomfort.  He was seen by cardiology and had cardiac CT done.  He was seen by Dr. Lemar Livings.  CXR done 09/21/2018 was negative.  He had labs done 09/21/2018 that showed WBC 8.4 HB 10.1 HCT 58 and plts 260,000.  Chemistries showed K+ 4.3 Cr 1.38.    Pt denies smoking and is a social drinker.  He was recently started on a statin.  He is on a baby ASA.  He denies any family history of liver issues.  He takes MVI, but is not on any hormone treatments.  He does not take iron.  He denies any headaches or blurred vision.  He reports occasional eye hemorrhages in the last 3 months.   Review of chart shows labs done 04/28/2015 showed WBC 6.3 HB 17.3 HCT 51 and plts 196,000.   Pt is seen today for consultation due to polycythemia.    Labs done today 10/09/2018 reviewed and showed WBC 7.8 HB 19.3 HCT 58 plts 233,000.   Chemistries WNL with K+ 4.2 Cr 1.39 and normal LFTs.  Ferritin 248.  Transferrin saturation 32.    Awaiting EPO, JaK2, BCR/ABL, hemochromatosis gene evaluation.  Hepatitis and HIV pending.   Due to significant elevation in HCT, he is recommended for 1 unit (500) ML phlebotomy. Included in differential is myeloproliferative disorder or hemochromatosis.   Pt will RTC in 2-3 weeks for follow-up to go over labs.  All questions answered and pt expressed understanding of the information presented.    2.  Facial erythema.  Likely due to polycythemia.  ANA pending.   Pt denies smoking history.  Pt will be phlebotomized 1 U today and follow-up in 2-3 weeks to go over lab results.  He is on baby ASA daily.  3.  Joint pain.  Awaiting SPEP, RF, Sed rate, CRP, ANA.  Pt will RTC to go over results.    4.  HTN.  BP is 144/91.  Follow-up with PCP.    5  Renal Insufficiency.  Cr is 1.38.  Awaiting SPEP results.    40 minutes spent with more than 50% spent in counseling and coordination of care and review of records.     HPI:  53 year old male who reports long history of facial erythema.  He reports he is followed by dermatology and denies any skin cancer history or rashes.  He uses sunscreen.  He reports his dad is also affected by facial erythema as well.  He denies smoking.  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  It subsided spontaneously after 10 to 15 minutes.  He is concerned about his potential for heart disease especially as he has a persistent discomfort.  He was seen by cardiology and had cardiac CT done.  He was seen by Dr. Lemar Livings.  CXR done 09/21/2018 was negative.  He had labs done 09/21/2018 that showed WBC 8.4 HB 10.1 HCT 58 and plts 260,000.  Chemistries showed K+ 4.3 Cr 1.38.    Pt denies smoking and is a social drinker.  He was recently started on a statin.  He is on a baby ASA.  He denies any family history of liver issues.  He takes MVI, but is not on any hormone treatments.  He denies any headaches or blurred vision.  He reports occasional eye hemorrhages in the last 3 months.   Review of chart shows  labs done 04/28/2015 showed WBC 6.3 HB 17.3 HCT 51 and plts 196,000.   Pt is seen today for consultation due to polycythemia.    Problem List Patient Active Problem List   Diagnosis Date Noted  . HLD (hyperlipidemia) [E78.5] 05/30/2015  . Abscess of left thigh [L02.419] 09/06/2011    Past Medical History Past Medical History:  Diagnosis Date  . Abscess    lt thigh  . Hyperlipidemia   . Hypertension     Past Surgical History Past Surgical History:  Procedure Laterality Date  . IRRIGATION AND DEBRIDEMENT ABSCESS     lt thigh  . SPINE SURGERY  Cervical Fusion    Family History Family History  Problem Relation Age of Onset  . Heart attack Mother   . Hypertension Father   . Hyperlipidemia Father   .  Heart attack Maternal Uncle   . Lung cancer Maternal Grandfather      Social History  reports that he has never smoked. He has never used smokeless tobacco. He reports that he does not drink alcohol or use drugs.  Medications  Current Outpatient Medications:  .  aspirin EC 325 MG tablet, Take 325 mg by mouth daily., Disp: , Rfl:  .  atorvastatin (LIPITOR) 40 MG tablet, Take 1 tablet (40 mg total) by mouth daily., Disp: 90 tablet, Rfl: 3 .  clobetasol (OLUX) 0.05 % topical foam, APPLY 1 GRAM ON THE SKIN AS DIRECTED (Patient taking differently: Apply 1 g topically as directed. On the scalp), Disp: 100 g, Rfl: 2 .  losartan (COZAAR) 50 MG tablet, Take 1 tablet (50 mg total) by mouth daily., Disp: 90 tablet, Rfl: 3 .  pantoprazole (PROTONIX) 40 MG tablet, Take 1 tablet (40 mg total) by mouth daily., Disp: 30 tablet, Rfl: 3  Allergies Patient has no known allergies.  Review of Systems Review of Systems - Oncology ROS negative   Physical Exam  Vitals Wt Readings from Last 3 Encounters:  10/09/18 233 lb 6.4 oz (105.9 kg)  09/21/18 234 lb 9.5 oz (106.4 kg)  09/08/18 234 lb (106.1 kg)   Temp Readings from Last 3 Encounters:  10/09/18 98 F (36.7 C) (Oral)  09/21/18 97.9 F (36.6 C) (Oral)  08/21/18 98.1 F (36.7 C) (Oral)   BP Readings from Last 3 Encounters:  10/09/18 (!) 144/91  10/07/18 117/70  09/21/18 (!) 144/94   Pulse Readings from Last 3 Encounters:  10/09/18 73  09/21/18 87  09/08/18 72   Constitutional: Well-developed, well-nourished, and in no distress.   HENT: Head: Normocephalic and atraumatic.  Mouth/Throat: No oropharyngeal exudate. Mucosa moist. Eyes: Pupils are equal, round, and reactive to light. Conjunctivae are normal. No scleral icterus.  Neck: Normal range of motion. Neck supple. No JVD present.  Cardiovascular: Normal rate, regular rhythm and normal heart sounds.  Exam reveals no gallop and no friction rub.   No murmur heard. Pulmonary/Chest:  Effort normal and breath sounds normal. No respiratory distress. No wheezes.No rales.  Abdominal: Soft. Bowel sounds are normal. No distension. There is no tenderness. There is no guarding.  Musculoskeletal: No edema or tenderness.  Lymphadenopathy: No cervical,axillary or supraclavicular adenopathy.  Neurological: Alert and oriented to person, place, and time. No cranial nerve deficit.  Skin: Skin is warm and dry. Facial erythema noted.   Psychiatric: Affect and judgment normal.   Labs No visits with results within 3 Day(s) from this visit.  Latest known visit with results is:  Admission on 09/21/2018, Discharged on 09/21/2018  Component Date Value Ref Range Status  . Sodium 09/21/2018 137  135 - 145 mmol/L Final  . Potassium 09/21/2018 4.3  3.5 - 5.1 mmol/L Final  . Chloride 09/21/2018 103  98 - 111 mmol/L Final  . CO2 09/21/2018 26  22 - 32 mmol/L Final  . Glucose, Bld 09/21/2018 113* 70 - 99 mg/dL Final  . BUN 09/21/2018 11  6 - 20 mg/dL Final  . Creatinine, Ser 09/21/2018 1.38* 0.61 - 1.24 mg/dL Final  . Calcium 09/21/2018 9.3  8.9 - 10.3 mg/dL Final  . GFR calc non Af Amer 09/21/2018 59* >60 mL/min Final  . GFR calc Af Amer 09/21/2018 >60  >60 mL/min Final  . Anion gap 09/21/2018 8  5 - 15 Final   Performed at [facility], [place] [address], [place], [place] [address]  . WBC 09/21/2018 8.4  4.0 - 10.5 K/uL Final  . RBC 09/21/2018 6.25* 4.22 - 5.81 MIL/uL Final  . Hemoglobin 09/21/2018 19.1* 13.0 - 17.0 g/dL Final  . HCT 09/21/2018 58.1* 39.0 - 52.0 % Final  . MCV 09/21/2018 93.0  80.0 - 100.0 fL Final  . MCH 09/21/2018 30.6  26.0 - 34.0 pg Final  . MCHC 09/21/2018 32.9  30.0 - 36.0 g/dL Final  . RDW 09/21/2018 13.0  11.5 - 15.5 % Final  . Platelets 09/21/2018 260  150 - 400 K/uL Final  . nRBC 09/21/2018 0.0  0.0 - 0.2 % Final   Performed at [facility], [place] [address] [place], [place] [address]  . Troponin i, poc 09/21/2018 0.00  0.00 - 0.08 ng/mL Final  .  Comment 3 09/21/2018  Final   Comment: Due to the release kinetics of cTnI, a negative result within the first hours of the onset of symptoms does not rule out myocardial infarction with certainty. If myocardial infarction is still suspected, repeat the test at appropriate intervals.   Marland Kitchen D-Dimer, Quant 09/21/2018 <0.27  0.00 - 0.50 ug/mL-FEU Final   Comment: SPECIMEN COLLECTED IN ANTICOAGULANT ADJUSTED TUBE DUE TO ELEVATED HEMATOCRIT (NOTE) At the manufacturer cut-off of 0.50 ug/mL FEU, this assay has been documented to exclude PE with a sensitivity and negative predictive value of 97 to 99%.  At this time, this assay has not been approved by the FDA to exclude DVT/VTE. Results should be correlated with clinical presentation. Performed at [facility], [place] [address], [place], [place] [address]   . Troponin i, poc 09/21/2018 0.01  0.00 - 0.08 ng/mL Final  . Comment 3 09/21/2018          Final   Comment: Due to the release kinetics of cTnI, a negative result within the first hours of the onset of symptoms does not rule out myocardial infarction with certainty. If myocardial infarction is still suspected, repeat the test at appropriate intervals.      Pathology Orders Placed This Encounter  Procedures  . CBC with Differential/Platelet    Standing Status:   Future    Standing Expiration Date:   10/10/2019  . Comprehensive metabolic panel    Standing Status:   Future    Standing Expiration Date:   10/10/2019  . Lactate dehydrogenase    Standing Status:   Future    Standing Expiration Date:   10/10/2019  . Protein electrophoresis, serum    Standing Status:   Future    Standing Expiration Date:   10/10/2019  . Ferritin    Standing Status:   Future    Standing Expiration Date:   10/10/2019  . Iron and TIBC    Standing Status:   Future    Standing Expiration Date:   10/10/2019  . Transferrin Saturation    Standing Status:   Future    Standing Expiration Date:    10/10/2019  . Hepatitis B core antibody, total    Standing Status:   Future    Standing Expiration Date:   10/10/2019  . Hepatitis B surface antibody,qualitative    Standing Status:   Future    Standing Expiration Date:   10/10/2019  . Hepatitis B surface antigen    Standing Status:   Future    Standing Expiration Date:   10/10/2019  . Hepatitis C RNA quantitative    Standing Status:   Future    Standing Expiration Date:   10/10/2019  . Hepatitis panel, acute    Standing Status:   Future    Standing Expiration Date:   10/10/2019  . HIV Antibody (routine testing w rflx)    Standing Status:   Future    Standing Expiration Date:   10/10/2019  . BCR-ABL1 FISH    Standing Status:   Future    Standing Expiration Date:   10/10/2019  . JAK2 V617F, w Reflex to CALR/E12/MPL    Standing Status:   Future    Standing Expiration Date:   10/10/2019  . Hemochromatosis DNA-PCR(c282y,h63d)    Standing Status:   Future    Standing Expiration Date:   10/10/2019  . Sedimentation rate    Standing Status:   Future    Standing Expiration Date:   10/10/2019  . C-reactive protein  Standing Status:   Future    Standing Expiration Date:   10/10/2019  . ANA, IFA (with reflex)    Standing Status:   Future    Standing Expiration Date:   10/10/2019  . Rheumatoid factor    Standing Status:   Future    Standing Expiration Date:   10/10/2019  . Erythropoietin    Standing Status:   Future    Standing Expiration Date:   10/10/2019       Zoila Shutter MD

## 2018-10-09 NOTE — Progress Notes (Signed)
Brandon Phelps presents today for phlebotomy per MD orders. RAC accessed with 18G needle. Phlebotomy procedure started at 17:11 and ended at 17:35 (IV site became occluded and had to be flushed to help patency) . 525 grams removed. IV needle removed intact.Patient tolerated procedure well. Patient observed for 30 minutes after procedure. Vitals stable and snack/drink provided. Ambulated out of clinic without incident.

## 2018-10-09 NOTE — Telephone Encounter (Signed)
Patient informed of cardiac CTA results and advised to contact our office if he has another episode of chest pain. A prescription for nitroglycerin to use as needed for chest pain has been sent to patient's pharmacy per Dr. Bettina Gavia. Patient is aware of how and when to take this medication. Advised patient that Dr. Bettina Gavia would review results in detail at his follow up appointment on 10/20/2018. Patient verbalized understanding. No further questions.

## 2018-10-09 NOTE — Telephone Encounter (Signed)
See most recent encounter.  

## 2018-10-09 NOTE — Telephone Encounter (Signed)
Left message to return call on patient's cell phone per DPR to discuss cardiac CTA results.

## 2018-10-09 NOTE — Patient Instructions (Signed)

## 2018-10-10 LAB — HEPATITIS PANEL, ACUTE
HCV Ab: <0.1 s/co ratio (ref 0.0–0.9)
Hep A IgM: NEGATIVE
Hep B C IgM: NEGATIVE
Hepatitis B Surface Ag: NEGATIVE

## 2018-10-10 LAB — HEPATITIS B CORE ANTIBODY, TOTAL: Hep B Core Total Ab: NEGATIVE

## 2018-10-10 LAB — ERYTHROPOIETIN: Erythropoietin: 9.2 m[IU]/mL (ref 2.6–18.5)

## 2018-10-10 LAB — HEPATITIS B SURFACE ANTIBODY,QUALITATIVE: Hep B S Ab: REACTIVE

## 2018-10-10 LAB — HIV ANTIBODY (ROUTINE TESTING W REFLEX): HIV Screen 4th Generation wRfx: NONREACTIVE

## 2018-10-10 LAB — RHEUMATOID FACTOR: Rheumatoid fact SerPl-aCnc: <10 IU/mL (ref 0.0–13.9)

## 2018-10-10 LAB — HEPATITIS B SURFACE ANTIGEN: Hepatitis B Surface Ag: NEGATIVE

## 2018-10-12 LAB — PROTEIN ELECTROPHORESIS, SERUM
A/G Ratio: 1.5 (ref 0.7–1.7)
Albumin ELP: 4.2 g/dL (ref 2.9–4.4)
Alpha-1-Globulin: 0.2 g/dL (ref 0.0–0.4)
Alpha-2-Globulin: 0.6 g/dL (ref 0.4–1.0)
Beta Globulin: 1.2 g/dL (ref 0.7–1.3)
GLOBULIN, TOTAL: 2.8 g/dL (ref 2.2–3.9)
Gamma Globulin: 0.8 g/dL (ref 0.4–1.8)
Total Protein ELP: 7 g/dL (ref 6.0–8.5)

## 2018-10-12 LAB — ANTINUCLEAR ANTIBODIES, IFA: ANTINUCLEAR ANTIBODIES, IFA: NEGATIVE

## 2018-10-13 ENCOUNTER — Telehealth: Payer: Self-pay | Admitting: *Deleted

## 2018-10-13 ENCOUNTER — Telehealth: Payer: Self-pay | Admitting: Internal Medicine

## 2018-10-13 NOTE — Telephone Encounter (Signed)
Per desk nurse moved 2/13 f/u to 2/14. Spoke with patient he is aware. Will discuss time w/[facility]H.

## 2018-10-13 NOTE — Telephone Encounter (Signed)
Patient called requesting lab results.  Advised Dr. Walden Field would need to review and her nurse will be notified of request for results.

## 2018-10-15 LAB — HEMOCHROMATOSIS DNA-PCR(C282Y,H63D)

## 2018-10-19 NOTE — Progress Notes (Signed)
Cardiology Office Note:    Date:  10/20/2018   ID:  STEPEHN ECKARD, DOB 28-Dec-1965, MRN [id number]  PCP:  Susy Frizzle, MD  Cardiologist:  Shirlee More, MD    Referring MD: Susy Frizzle, MD    ASSESSMENT:    1. Enlarged thoracic aorta ([place])   2. Coronary-myocardial bridge   3. Erythrocytosis   4. Essential hypertension    PLAN:    In order of problems listed above:  1. Plan repeat CT scan in 1 year and hypertension control 2. Typically a curiosity but he is having episodes of angina and will use nitroglycerin as needed infrequently could delay her additional antianginal medication.  He chooses to remain on a statin 3. He has had phlebotomies seen by hematology oncology 4. Stable continue current antihypertensives   Next appointment: 1 year   Medication Adjustments/Labs and Tests Ordered: Current medicines are reviewed at length with the patient today.  Concerns regarding medicines are outlined above.  No orders of the defined types were placed in this encounter.  No orders of the defined types were placed in this encounter.   Chief Complaint  Patient presents with  . Follow-up    History of Present Illness:    Brandon Phelps is a 53 y.o. male with a hx of chest pain last seen 09/08/18.  Cardiac CTA 10/07/18:   IMPRESSION: 1. Coronary calcium score of 0. This was 0 percentile for age and sex matched control. 2. Normal coronary origin with right dominance. 3. No evidence of CAD. 4.  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 5.  Mild aneurysmal disease of the visualized ascending thoracic aorta measuring approximately 4.1-4.2 cm in greatest diameter   Compliance with diet, lifestyle and medications: yes  He is seen in the office to review findings of his coronary CTA.  There recent episode of elevated blood pressure and rapid heart rate and his rhythm was sinus tachycardia is noted to have marked erythrocytosis seen by hematology evaluated and had  phlebotomy performed for his polycythemia.  Since then he has felt well his blood pressure is well controlled on current medications we will plan to do a follow-up CT of his thoracic aorta in 1 year typically a good blood pressure control there is not marked progression he has nitroglycerin to take if he has episodes of angina related to coronary artery bridge.  He voices understanding of his disorder and has no edema shortness of breath chest pain or palpitation since his ED visit. Past Medical History:  Diagnosis Date  . Abscess    lt thigh  . Hyperlipidemia   . Hypertension     Past Surgical History:  Procedure Laterality Date  . IRRIGATION AND DEBRIDEMENT ABSCESS     lt thigh  . SPINE SURGERY  Cervical Fusion    Current Medications: Current Meds  Medication Sig  . atorvastatin (LIPITOR) 40 MG tablet Take 1 tablet (40 mg total) by mouth daily.  . clobetasol (OLUX) 0.05 % topical foam APPLY 1 GRAM ON THE SKIN AS DIRECTED (Patient taking differently: Apply 1 g topically as directed. On the scalp)  . losartan (COZAAR) 50 MG tablet Take 1 tablet (50 mg total) by mouth daily.  . nitroGLYCERIN (NITROSTAT) 0.4 MG SL tablet Place 1 tablet (0.4 mg total) under the tongue every 5 (five) minutes as needed for chest pain.  . pantoprazole (PROTONIX) 40 MG tablet Take 1 tablet (40 mg total) by mouth daily.  . [DISCONTINUED] aspirin  EC 325 MG tablet Take 325 mg by mouth daily.     Allergies:   Patient has no known allergies.   Social History   Socioeconomic History  . Marital status: Married    Spouse name: Not on file  . Number of children: Not on file  . Years of education: Not on file  . Highest education level: Not on file  Occupational History  . Not on file  Social Needs  . Financial resource strain: Not on file  . Food insecurity:    Worry: Not on file    Inability: Not on file  . Transportation needs:    Medical: Not on file    Non-medical: Not on file  Tobacco Use  .  Smoking status: Never Smoker  . Smokeless tobacco: Never Used  Substance and Sexual Activity  . Alcohol use: No  . Drug use: No  . Sexual activity: Not on file  Lifestyle  . Physical activity:    Days per week: Not on file    Minutes per session: Not on file  . Stress: Not on file  Relationships  . Social connections:    Talks on phone: Not on file    Gets together: Not on file    Attends religious service: Not on file    Active member of club or organization: Not on file    Attends meetings of clubs or organizations: Not on file    Relationship status: Not on file  Other Topics Concern  . Not on file  Social History Narrative  . Not on file     Family History: The patient's family history includes Heart attack in his maternal uncle and mother; Hyperlipidemia in his father; Hypertension in his father; Lung cancer in his maternal grandfather. ROS:   Please see the history of present illness.    All other systems reviewed and are negative.  EKGs/Labs/Other Studies Reviewed:    The following studies were reviewed today:  Recent Labs: 10/09/2018: ALT 44; BUN 12; Creatinine, Ser 1.39; Hemoglobin 19.3; Platelets 233; Potassium 4.2; Sodium 142  Recent Lipid Panel    Component Value Date/Time   CHOL 135 09/08/2018 0937   TRIG 58 09/08/2018 0937   HDL 43 09/08/2018 0937   CHOLHDL 3.1 09/08/2018 0937   CHOLHDL 4.9 05/30/2015 0826   VLDL 19 05/30/2015 0826   LDLCALC 80 09/08/2018 0937    Physical Exam:    VS:  BP 114/82 (BP Location: Right Arm, Patient Position: Sitting, Cuff Size: Large)   Pulse 76   Ht 6\' 1"  (1.854 m)   Wt 232 lb (105.2 kg)   SpO2 95%   BMI 30.61 kg/m     Wt Readings from Last 3 Encounters:  10/20/18 232 lb (105.2 kg)  10/09/18 233 lb 6.4 oz (105.9 kg)  09/21/18 234 lb 9.5 oz (106.4 kg)     GEN: flushed Well nourished, well developed in no acute distress HEENT: Normal NECK: No JVD; No carotid bruits LYMPHATICS: No lymphadenopathy CARDIAC:  RRR, no murmurs, rubs, gallops RESPIRATORY:  Clear to auscultation without rales, wheezing or rhonchi  ABDOMEN: Soft, non-tender, non-distended MUSCULOSKELETAL:  No edema; No deformity  SKIN: Warm and dry NEUROLOGIC:  Alert and oriented x 3 PSYCHIATRIC:  Normal affect    Signed, Shirlee More, MD  10/20/2018 12:12 PM    Martha

## 2018-10-20 ENCOUNTER — Ambulatory Visit: Payer: Managed Care, Other (non HMO) | Admitting: Cardiology

## 2018-10-20 ENCOUNTER — Encounter: Payer: Self-pay | Admitting: Cardiology

## 2018-10-20 VITALS — BP 114/82 | HR 76 | Ht 73.0 in | Wt 232.0 lb

## 2018-10-20 DIAGNOSIS — I7789 Other specified disorders of arteries and arterioles: Secondary | ICD-10-CM | POA: Diagnosis not present

## 2018-10-20 DIAGNOSIS — I10 Essential (primary) hypertension: Secondary | ICD-10-CM | POA: Diagnosis not present

## 2018-10-20 DIAGNOSIS — Q245 Malformation of coronary vessels: Secondary | ICD-10-CM | POA: Diagnosis not present

## 2018-10-20 DIAGNOSIS — D751 Secondary polycythemia: Secondary | ICD-10-CM | POA: Diagnosis not present

## 2018-10-20 NOTE — Patient Instructions (Signed)
Medication Instructions:  Your physician has recommended you make the following change in your medication: DISCONTINUE taking aspirin    If you need a refill on your cardiac medications before your next appointment, please call your pharmacy.   Lab work: NONE   Testing/Procedures: NONE  Follow-Up: At [facility], you and your health needs are our priority.  As part of our continuing mission to provide you with exceptional heart care, we have created designated Provider Care Teams.  These Care Teams include your primary Cardiologist (physician) and Advanced Practice Providers (APPs -  Physician Assistants and Nurse Practitioners) who all work together to provide you with the care you need, when you need it. You will need a follow up appointment in 1 years.  Please call our office 2 months in advance to schedule this appointment.  You may see No primary care provider on file.

## 2018-10-22 ENCOUNTER — Other Ambulatory Visit: Payer: Self-pay | Admitting: Family Medicine

## 2018-10-22 ENCOUNTER — Ambulatory Visit: Payer: Managed Care, Other (non HMO) | Admitting: Internal Medicine

## 2018-10-23 ENCOUNTER — Inpatient Hospital Stay: Payer: Managed Care, Other (non HMO) | Attending: Internal Medicine | Admitting: Internal Medicine

## 2018-10-23 ENCOUNTER — Telehealth: Payer: Self-pay | Admitting: Internal Medicine

## 2018-10-23 DIAGNOSIS — Z7982 Long term (current) use of aspirin: Secondary | ICD-10-CM

## 2018-10-23 DIAGNOSIS — N289 Disorder of kidney and ureter, unspecified: Secondary | ICD-10-CM

## 2018-10-23 DIAGNOSIS — L538 Other specified erythematous conditions: Secondary | ICD-10-CM

## 2018-10-23 DIAGNOSIS — M2550 Pain in unspecified joint: Secondary | ICD-10-CM

## 2018-10-23 DIAGNOSIS — I10 Essential (primary) hypertension: Secondary | ICD-10-CM

## 2018-10-23 NOTE — Progress Notes (Signed)
Diagnosis Hereditary hemochromatosis ([place]) - Plan: Phlebotomy therapeutic, CBC with Differential/Platelet, Comprehensive metabolic panel, Lactate dehydrogenase, Ferritin, Iron and TIBC, Transferrin Saturation  Staging Cancer Staging No matching staging information was found for the patient.  Assessment and Plan:  1.   Polycythemia.  53 year old male who reports long history of facial erythema.  He reports he is followed by dermatology and denies any skin cancer history or rashes.  He uses sunscreen.  He reports his dad is also affected by facial erythema as well.  He denies smoking.  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  It subsided spontaneously after 10 to 15 minutes.  He is concerned about his potential for heart disease especially as he has a persistent discomfort.  He was seen by cardiology and had cardiac CT done.  He was seen by Dr. Lemar Livings.  CXR done 09/21/2018 was negative.  He had labs done 09/21/2018 that showed WBC 8.4 HB 10.1 HCT 58 and plts 260,000.  Chemistries showed K+ 4.3 Cr 1.38.    Pt denies smoking and is a social drinker.  He was recently started on a statin.  He is on a baby ASA.  He denies any family history of liver issues.  He takes MVI, but is not on any hormone treatments.  He does not take iron.  He denies any headaches or blurred vision.  He reports occasional eye hemorrhages in the last 3 months.   Review of chart shows labs done 04/28/2015 showed WBC 6.3 HB 17.3 HCT 51 and plts 196,000.   Pt is seen today for consultation due to polycythemia.    Labs done 10/09/2018 reviewed and showed WBC 7.8 HB 19.3 HCT 58 plts 233,000.   Chemistries WNL with K+ 4.2 Cr 1.39 and normal LFTs.  Ferritin 248.  Transferrin saturation 32.    Pt has normal EPO, JaK2, BCR/ABL.  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 Results for C282Y and S65C were negative. This person is most likely an unaffected carrier.   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   HIV testing is negative.  Due to significant elevation in HCT, he was recommended for 1 unit (500) ML phlebotomy. Pt reports improvement in itching.  Facial erythema is also improved.  He will be set up for two additional 1 Unit phlebotomies 2 weeks apart.  He will have repeat labs in 11/2018.  Goal to get ferritin level 50 -100. Pt advised to avoid excess Vitamin C due to risk of increased iron absorption with excess vitamin C intake. He should also avoid excess alcohol intake.   First degree relative may be tested once the are adults.  He reports he has a son age 72 and daughter age 57. They may be screened with iron levels.  He reports father also has facial erythema.  I have informed him he should consider screening with iron levels.  Pt was provided written information regarding Hemochromatosis.  He will be seen for follow-up in 11/2018 with labs.  All questions answered and pt expressed understanding of the information presented.    2.  Facial erythema.  Improved after phlebotomy. Negative ANA.  Jak 2 and BCR/ABL are negative.  Pt denies smoking history.  Pt was phlebotomized 1 U.  He will be set up for 2 additional phlebotomies 2 weeks apart.  He is on baby ASA daily.    3.  Joint pain.  He has a normal SPEP, RF, Sed rate, CRP, ANA.  Symptomatic therapy recommended.    4.  HTN.  BP is 114/76.  Follow-up with PCP.    5  Renal Insufficiency.  Cr is 1.38.  Negative SPEP.    25  minutes spent with more than 50% spent in counseling and coordination of care and review of records.    Interval History:  Historical data obtained from note dated 10/09/2018:  53 year old male who reports long history of facial erythema.  He reports he is followed by dermatology and denies  any skin cancer history or rashes.  He uses sunscreen.  He reports his dad is also affected by facial erythema as well.  He denies smoking.  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  It subsided spontaneously after 10 to 15 minutes.  He is concerned about his potential for heart disease especially as he has a persistent discomfort.  He was seen by cardiology and had cardiac CT done.  He was seen by Dr. Lemar Livings.  CXR done 09/21/2018 was negative.  He had labs done 09/21/2018 that showed WBC 8.4 HB 10.1 HCT 58 and plts 260,000.  Chemistries showed K+ 4.3 Cr 1.38.    Pt denies smoking and is a social drinker.  He was recently started on a statin.  He is on a baby ASA.  He denies any family history of liver issues.  He takes MVI, but is not on any hormone treatments.  He denies any headaches or blurred vision.  He reports occasional eye hemorrhages in the last 3 months.   Review of chart shows  labs done 04/28/2015 showed WBC 6.3 HB 17.3 HCT 51 and plts 196,000.    Current Status:  Pt is seen today for follow-up. He is here to go over labs.  He reports itching improved after phlebotomy.    Problem List Patient Active Problem List   Diagnosis Date Noted  . 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 Erythrocytosis [D75.1] 10/20/2018  . Essential hypertension [I10] 10/20/2018  . HLD (hyperlipidemia) [E78.5] 05/30/2015  . Abscess of left thigh [L02.419] 09/06/2011    Past Medical History Past Medical History:  Diagnosis Date  . Abscess    lt thigh  . Hyperlipidemia   . Hypertension     Past Surgical History Past Surgical History:  Procedure Laterality Date  . IRRIGATION AND DEBRIDEMENT ABSCESS     lt thigh  . SPINE SURGERY  Cervical Fusion    Family History Family History  Problem Relation Age of Onset   . Heart attack Mother   . Hypertension Father   . Hyperlipidemia Father   . Heart attack Maternal Uncle   . Lung cancer Maternal Grandfather      Social History  reports that he has never smoked. He has never used smokeless tobacco. He reports that he does not drink alcohol or use drugs.  Medications  Current Outpatient Medications:  .  atorvastatin (LIPITOR) 40 MG tablet, Take 1 tablet (40 mg total) by mouth daily., Disp: 90 tablet,  Rfl: 3 .  losartan (COZAAR) 50 MG tablet, Take 1 tablet (50 mg total) by mouth daily., Disp: 90 tablet, Rfl: 3 .  pantoprazole (PROTONIX) 40 MG tablet, Take 1 tablet (40 mg total) by mouth daily., Disp: 30 tablet, Rfl: 3 .  clobetasol (OLUX) 0.05 % topical foam, APPLY 1 GRAM ON THE SKIN AS DIRECTED, Disp: 100 g, Rfl: 2 .  nitroGLYCERIN (NITROSTAT) 0.4 MG SL tablet, Place 1 tablet (0.4 mg total) under the tongue every 5 (five) minutes as needed for chest pain., Disp: 25 tablet, Rfl: 1  Allergies Patient has no known allergies.  Review of Systems Review of Systems - Oncology ROS negative   Physical Exam  Vitals Wt Readings from Last 3 Encounters:  10/23/18 231 lb 12.8 oz (105.1 kg)  10/20/18 232 lb (105.2 kg)  10/09/18 233 lb 6.4 oz (105.9 kg)   Temp Readings from Last 3 Encounters:  10/23/18 98 F (36.7 C) (Oral)  10/09/18 98.2 F (36.8 C) (Oral)  10/09/18 98 F (36.7 C) (Oral)   BP Readings from Last 3 Encounters:  10/23/18 114/76  10/20/18 114/82  10/09/18 112/75   Pulse Readings from Last 3 Encounters:  10/23/18 71  10/20/18 76  10/09/18 77   Constitutional: Well-developed, well-nourished, and in no distress.   HENT: Head: Normocephalic and atraumatic.  Mouth/Throat: No oropharyngeal exudate. Mucosa moist. Eyes: Pupils are equal, round, and reactive to light. Conjunctivae are normal. No scleral icterus.  Neck: Normal range of motion. Neck supple. No JVD present.  Cardiovascular: Normal rate, regular rhythm and normal heart  sounds.  Exam reveals no gallop and no friction rub.   No murmur heard. Pulmonary/Chest: Effort normal and breath sounds normal. No respiratory distress. No wheezes.No rales.  Abdominal: Soft. Bowel sounds are normal. No distension. There is no tenderness. There is no guarding.  Musculoskeletal: No edema or tenderness.  Lymphadenopathy: No cervical, axillary or supraclavicular adenopathy.  Neurological: Alert and oriented to person, place, and time. No cranial nerve deficit.  Skin: Skin is warm and dry. Facial erythema is improved.   Psychiatric: Affect and judgment normal.   Labs No visits with results within 3 Day(s) from this visit.  Latest known visit with results is:  Appointment on 10/09/2018  Component Date Value Ref Range Status  . WBC 10/09/2018 7.8  4.0 - 10.5 K/uL Final  . RBC 10/09/2018 6.37* 4.22 - 5.81 MIL/uL Final  . Hemoglobin 10/09/2018 19.3* 13.0 - 17.0 g/dL Final  . HCT 10/09/2018 58.2* 39.0 - 52.0 % Final  . MCV 10/09/2018 91.4  80.0 - 100.0 fL Final  . MCH 10/09/2018 30.3  26.0 - 34.0 pg Final  . MCHC 10/09/2018 33.2  30.0 - 36.0 g/dL Final  . RDW 10/09/2018 12.9  11.5 - 15.5 % Final  . Platelets 10/09/2018 233  150 - 400 K/uL Final  . nRBC 10/09/2018 0.0  0.0 - 0.2 % Final  . Neutrophils Relative % 10/09/2018 64  % Final  . Neutro Abs 10/09/2018 5.0  1.7 - 7.7 K/uL Final  . Lymphocytes Relative 10/09/2018 25  % Final  . Lymphs Abs 10/09/2018 1.9  0.7 - 4.0 K/uL Final  . Monocytes Relative 10/09/2018 9  % Final  . Monocytes Absolute 10/09/2018 0.7  0.1 - 1.0 K/uL Final  . Eosinophils Relative 10/09/2018 1  % Final  . Eosinophils Absolute 10/09/2018 0.1  0.0 - 0.5 K/uL Final  . Basophils Relative 10/09/2018 1  % Final  . Basophils Absolute 10/09/2018 0.1  0.0 - 0.1 K/uL Final  . Immature Granulocytes 10/09/2018 0  % Final  . Abs Immature Granulocytes 10/09/2018 0.02  0.00 - 0.07 K/uL Final   Performed at [facility], [place] [address], [place], [place] [address]  . Sodium 10/09/2018 142  135 - 145 mmol/L Final  . Potassium 10/09/2018 4.2  3.5 - 5.1 mmol/L Final  . Chloride 10/09/2018 102  98 - 111 mmol/L Final  . CO2 10/09/2018 30  22 - 32 mmol/L Final  . Glucose, Bld 10/09/2018 76  70 - 99 mg/dL Final  . BUN 10/09/2018 12  6 - 20 mg/dL Final  . Creatinine, Ser 10/09/2018 1.39* 0.61 - 1.24 mg/dL Final  . Calcium 10/09/2018 9.8  8.9 - 10.3 mg/dL Final  . Total Protein 10/09/2018 7.9  6.5 - 8.1 g/dL Final  . Albumin 10/09/2018 4.6  3.5 - 5.0 g/dL Final  . AST 10/09/2018 33  15 - 41 U/L Final  . ALT 10/09/2018 44  0 - 44 U/L Final  . Alkaline Phosphatase 10/09/2018 62  38 - 126 U/L Final  . Total Bilirubin 10/09/2018 1.2  0.3 - 1.2 mg/dL Final  . GFR calc non Af Amer 10/09/2018 59* >60 mL/min Final  . GFR calc Af Amer 10/09/2018 >60  >60 mL/min Final  . Anion gap 10/09/2018 10  5 - 15 Final   Performed at [facility], [place] [address] [place], [place] [address]  . LDH 10/09/2018 195* 98 - 192 U/L Final   Performed at [facility], [place] [address], [place], [place] [address]  . Total Protein ELP 10/09/2018 7.0  6.0 - 8.5 g/dL Final  . Albumin ELP 10/09/2018 4.2  2.9 - 4.4 g/dL Final  . Alpha-1-Globulin 10/09/2018 0.2  0.0 - 0.4 g/dL Final  . Alpha-2-Globulin 10/09/2018 0.6  0.4 - 1.0 g/dL Final  . Beta Globulin 10/09/2018 1.2  0.7 - 1.3 g/dL Final  . Gamma Globulin 10/09/2018 0.8  0.4 - 1.8 g/dL Final  . M-Spike, % 10/09/2018 Not Observed  Not Observed g/dL Final  . SPE Interp. 10/09/2018 Comment   Final   Comment: (NOTE) The SPE pattern appears essentially unremarkable. Evidence of monoclonal protein is not apparent. Performed At: [facility] [place], [place] [id number] Rush Farmer MD JH:[id number]   . Comment 10/09/2018 Comment   Final   Comment: (NOTE) Protein electrophoresis scan will follow via computer, mail, or courier delivery.    . Globulin, Total 10/09/2018 2.8  2.2 - 3.9 g/dL Corrected  . A/G Ratio 10/09/2018 1.5  0.7 - 1.7 Corrected  . Ferritin 10/09/2018 248  24 - 336 ng/mL Final   Performed at [facility], [place] [address] [place], [place] [address]  . Iron 10/09/2018 129  42 - 163 ug/dL Final  . TIBC 10/09/2018 400  202 - 409 ug/dL Final  . Saturation Ratios 10/09/2018 32  20 - 55 % Final  . UIBC 10/09/2018 271  117 - 376 ug/dL Final   Performed at [facility], [place] [address], [place], [place] [address]  . Hep B Core Total Ab 10/09/2018 Negative  Negative Final   Comment: (NOTE) Performed At: [facility] [place], [place] [id number] Rush Farmer MD OY:[id number]   . Hep B S Ab 10/09/2018 Reactive   Final   Comment: (NOTE)  Non Reactive: Inconsistent with immunity,                            less than 10 mIU/mL              Reactive:     Consistent with immunity,                            greater than 9.9 mIU/mL Performed At: [facility] [place], [place] [id number] Rush Farmer MD ZC:[id number]   . Hepatitis B Surface Ag 10/09/2018 Negative  Negative Final   Comment: (NOTE) Performed At: [facility] [place], [place] [id number] Rush Farmer MD CN:[id number]   . Hepatitis B Surface Ag 10/09/2018 Negative  Negative Final  . HCV Ab 10/09/2018 <0.1  0.0 - 0.9 s/co ratio Final   Comment: (NOTE)                                  Negative:     < 0.8                             Indeterminate: 0.8 - 0.9                                  Positive:     > 0.9 The CDC recommends that a positive HCV antibody result be followed up with a HCV Nucleic Acid Amplification test (294765). Performed At: [facility] [place], [place] [id number] Rush Farmer MD [id number]   . Hep A IgM 10/09/2018 Negative  Negative Final  . Hep B C IgM 10/09/2018  Negative  Negative Final  . HIV Screen 4th Generation wRfx 10/09/2018 Non Reactive  Non Reactive Final   Comment: (NOTE) Performed At: [facility] [place], [place] [id number] Rush Farmer MD FM:[id number]   . DNA Mutation Analysis 10/09/2018 Comment   Final   Comment: (NOTE) Result: CARRIER Single mutation (H63D) identified Interpretation: This patient's sample was analyzed for the hereditary hemochromatosis (HH) mutations C282Y, H63D, and S65C.  A single copy of H63D was identified.  Results for C282Y and S65C were negative. This person is most likely an unaffected carrier. Approximately 1 in 9 Caucasians are carriers of HH.  The mutations analyzed by [facility] are most common in the Caucasian population. Because this panel does not identify rare HH mutations or HH mutations found in other ethnic groups, there are a small number of people who may have a single copy of H63D who are actually affected. The diagnosis of HH should include clinical findings and other test results, such as transferrin-iron saturation and/or serum ferritin studies and/or liver biopsy.  HH is inherited in a recessive manner. Should this individual have children with a partner who is also a carrier for [facility], there is a 25% chance per offspring that he/she is aff                          ected.  Genetic counseling and HH molecular testing are recommended for at-risk family members. Methodology: DNA Analysis of the HFE gene was performed by PCR amplification followed by restriction enzyme digestion analyses. Reference:  Cathe Mons and Walker AP. (2000). Genet Test 4:97-101. Cogswell ME et al. (1999). AM J Prev Med 16:134-140. Bomford A (2002). Lancet 360(9346):1673-81. Stan Head et al. (2002). Blood Cells, Molecules. and Diseases.  29(3):418-432. Imperatore G et al. (2003). Genet Med. 5(1):1-8. Cogswell ME et al. (2003). Genet Med. 5(4):304-10. This test was developed and its  performance characteristics determined by [facility]. It has not been cleared or approved by the Food and Drug Administration. Genetic counselors are available for health care providers to discuss results at 1-800-345-GENE. Allison Quarry, PhD, [facility] Ruben Reason, PhD, [facility] Annetta Maw, M.S., PhD, [facility] Alfredo Bach, PhD, [facility] Norva Riffle, PhD, [facility] Brunetta Genera, PhD, [facility] Performed At: [facility] [address] [place], [place] [id number] Nechama Guard MD UD:[id number]   . Sed Rate 10/09/2018 0  0 - 16 mm/hr Final   Performed at [facility], [place] [address], [place], [place] [address]  . CRP 10/09/2018 <0.8  <1.0 mg/dL Final   Performed at [place] [address], [place], [place] [address]  . ANA Ab, IFA 10/09/2018 Negative   Final   Comment: (NOTE)                                     Negative   <1:80                                     Borderline  1:80                                     Positive   >1:80 Performed At: [facility] [place], [place] [id number] Rush Farmer MD [id number]   . Rhuematoid fact SerPl-aCnc 10/09/2018 <10.0  0.0 - 13.9 IU/mL Final   Comment: (NOTE) Performed At: [facility] [place], [place] [id number] Rush Farmer MD [id number]   . Erythropoietin 10/09/2018 9.2  2.6 - 18.5 mIU/mL Final   Comment: (NOTE) [facility] DxI [place] obtained with different assay methods or kits cannot be used interchangeably. Results cannot be interpreted as absolute evidence of the presence or absence of malignant disease. Performed At: [facility] [place], [place] [id number] Rush Farmer MD WH:[id number]      Pathology Orders Placed This Encounter  Procedures  . Phlebotomy therapeutic    Standing Status:   Future    Standing Expiration Date:    10/24/2019    Scheduling Instructions:     Please schedule Two 1 unit ( 500 ml phlebotomies)  . CBC with Differential/Platelet    Standing Status:   Future    Standing Expiration Date:   10/24/2019  . Comprehensive metabolic panel    Standing Status:   Future    Standing Expiration Date:   10/24/2019  . Lactate dehydrogenase    Standing Status:   Future    Standing Expiration Date:   10/24/2019  . Ferritin    Standing Status:   Future    Standing Expiration Date:   10/24/2019  . Iron and TIBC  Standing Status:   Future    Standing Expiration Date:   10/24/2019  . Transferrin Saturation    Standing Status:   Future    Standing Expiration Date:   10/24/2019       Zoila Shutter MD

## 2018-10-23 NOTE — Telephone Encounter (Signed)
Scheduled appt per 02/14 los.  Printed calendar and avs.

## 2018-10-29 ENCOUNTER — Inpatient Hospital Stay: Payer: Managed Care, Other (non HMO)

## 2018-10-29 NOTE — Progress Notes (Signed)
Brandon Phelps presents today for phlebotomy per MD orders. Phlebotomy procedure started w/ 16 G in L AC at 1527 and ended at 1533 540 grams removed. Patient observed for 30 minutes after procedure without any incident. VSS at discharge. Patient tolerated procedure well. IV needle removed intact.

## 2018-10-29 NOTE — Patient Instructions (Signed)

## 2018-10-30 LAB — BCR ABL1 FISH (GENPATH)

## 2018-10-30 LAB — JAK2 (INCLUDING V617F AND EXON 12), MPL,& CALR W/RFL MPN PANEL (NGS)

## 2018-11-12 ENCOUNTER — Inpatient Hospital Stay: Payer: Managed Care, Other (non HMO) | Attending: Internal Medicine

## 2018-11-13 ENCOUNTER — Ambulatory Visit: Payer: Managed Care, Other (non HMO) | Admitting: Internal Medicine

## 2018-11-20 ENCOUNTER — Ambulatory Visit: Payer: Managed Care, Other (non HMO) | Admitting: Internal Medicine

## 2018-11-20 ENCOUNTER — Other Ambulatory Visit: Payer: Self-pay

## 2018-11-20 ENCOUNTER — Other Ambulatory Visit: Payer: Self-pay | Admitting: Internal Medicine

## 2018-11-20 ENCOUNTER — Inpatient Hospital Stay: Payer: Managed Care, Other (non HMO)

## 2018-11-20 LAB — CBC WITH DIFFERENTIAL/PLATELET
ABS IMMATURE GRANULOCYTES: 0.02 10*3/uL (ref 0.00–0.07)
BASOS ABS: 0.1 10*3/uL (ref 0.0–0.1)
Basophils Relative: 1 %
Eosinophils Absolute: 0.2 10*3/uL (ref 0.0–0.5)
Eosinophils Relative: 3 %
HCT: 48.8 % (ref 39.0–52.0)
Hemoglobin: 15.6 g/dL (ref 13.0–17.0)
Immature Granulocytes: 0 %
Lymphocytes Relative: 23 %
Lymphs Abs: 1.5 10*3/uL (ref 0.7–4.0)
MCH: 29.9 pg (ref 26.0–34.0)
MCHC: 32 g/dL (ref 30.0–36.0)
MCV: 93.5 fL (ref 80.0–100.0)
Monocytes Absolute: 0.5 10*3/uL (ref 0.1–1.0)
Monocytes Relative: 8 %
NEUTROS ABS: 4.4 10*3/uL (ref 1.7–7.7)
NEUTROS PCT: 65 %
PLATELETS: 245 10*3/uL (ref 150–400)
RBC: 5.22 MIL/uL (ref 4.22–5.81)
RDW: 14.5 % (ref 11.5–15.5)
WBC: 6.7 10*3/uL (ref 4.0–10.5)
nRBC: 0 % (ref 0.0–0.2)

## 2018-11-20 LAB — COMPREHENSIVE METABOLIC PANEL
ALT: 25 U/L (ref 0–44)
AST: 25 U/L (ref 15–41)
Albumin: 3.7 g/dL (ref 3.5–5.0)
Alkaline Phosphatase: 55 U/L (ref 38–126)
Anion gap: 10 (ref 5–15)
BUN: 11 mg/dL (ref 6–20)
CHLORIDE: 105 mmol/L (ref 98–111)
CO2: 26 mmol/L (ref 22–32)
Calcium: 8.7 mg/dL — ABNORMAL LOW (ref 8.9–10.3)
Creatinine, Ser: 1.35 mg/dL — ABNORMAL HIGH (ref 0.61–1.24)
GFR calc Af Amer: >60 mL/min (ref >60)
GFR calc non Af Amer: >60 mL/min (ref >60)
Glucose, Bld: 124 mg/dL — ABNORMAL HIGH (ref 70–99)
Potassium: 4.2 mmol/L (ref 3.5–5.1)
Sodium: 141 mmol/L (ref 135–145)
Total Bilirubin: 1 mg/dL (ref 0.3–1.2)
Total Protein: 6.4 g/dL — ABNORMAL LOW (ref 6.5–8.1)

## 2018-11-20 LAB — IRON AND TIBC
Iron: 87 ug/dL (ref 42–163)
Saturation Ratios: 24 % (ref 20–55)
TIBC: 369 ug/dL (ref 202–409)
UIBC: 282 ug/dL (ref 117–376)

## 2018-11-20 LAB — LACTATE DEHYDROGENASE: LDH: 188 U/L (ref 98–192)

## 2018-11-20 LAB — FERRITIN: Ferritin: 91 ng/mL (ref 24–336)

## 2018-11-22 LAB — MISC LABCORP TEST (SEND OUT): Labcorp test code: 501819

## 2018-11-27 ENCOUNTER — Inpatient Hospital Stay: Payer: Managed Care, Other (non HMO) | Admitting: Internal Medicine

## 2019-01-01 ENCOUNTER — Ambulatory Visit: Payer: Managed Care, Other (non HMO) | Admitting: Internal Medicine

## 2019-02-07 ENCOUNTER — Other Ambulatory Visit: Payer: Self-pay | Admitting: Family Medicine

## 2019-04-21 ENCOUNTER — Telehealth: Payer: Self-pay | Admitting: Hematology

## 2019-04-21 NOTE — Telephone Encounter (Signed)
[facility] transfer to [place]. Spoke with patient re transfer and setting up follow up. Per patient he would like to do some follow up research before making a decision and will call back to schedule.

## 2019-06-23 ENCOUNTER — Other Ambulatory Visit: Payer: Self-pay | Admitting: Family Medicine

## 2019-08-09 ENCOUNTER — Other Ambulatory Visit: Payer: Self-pay

## 2019-08-09 ENCOUNTER — Other Ambulatory Visit: Payer: Managed Care, Other (non HMO)

## 2019-08-09 DIAGNOSIS — Q245 Malformation of coronary vessels: Secondary | ICD-10-CM

## 2019-08-09 DIAGNOSIS — M545 Low back pain, unspecified: Secondary | ICD-10-CM | POA: Insufficient documentation

## 2019-08-09 DIAGNOSIS — M791 Myalgia, unspecified site: Secondary | ICD-10-CM | POA: Insufficient documentation

## 2019-08-09 DIAGNOSIS — D751 Secondary polycythemia: Secondary | ICD-10-CM

## 2019-08-09 DIAGNOSIS — E785 Hyperlipidemia, unspecified: Secondary | ICD-10-CM

## 2019-08-10 ENCOUNTER — Other Ambulatory Visit: Payer: Managed Care, Other (non HMO)

## 2019-08-10 LAB — C-REACTIVE PROTEIN: CRP: 2.2 mg/L (ref <8.0)

## 2019-08-11 LAB — IMMUNOFIXATION ELECTROPHORESIS
IgG (Immunoglobin G), Serum: 819 mg/dL (ref 600–1640)
IgM, Serum: 62 mg/dL (ref 50–300)
Immunofix Electr Int: NOT DETECTED
Immunoglobulin A: 305 mg/dL (ref 47–310)

## 2019-08-13 ENCOUNTER — Other Ambulatory Visit: Payer: Self-pay | Admitting: Family Medicine

## 2019-08-13 DIAGNOSIS — R748 Abnormal levels of other serum enzymes: Secondary | ICD-10-CM

## 2019-08-13 DIAGNOSIS — T466X5A Adverse effect of antihyperlipidemic and antiarteriosclerotic drugs, initial encounter: Secondary | ICD-10-CM

## 2019-08-13 LAB — PROTEIN,TOTAL AND ELECTROPHOR W/IFE
Albumin ELP: 3.8 g/dL (ref 3.8–4.8)
Alpha 1: 0.3 g/dL (ref 0.2–0.3)
Alpha 2: 0.6 g/dL (ref 0.5–0.9)
Beta 2: 0.4 g/dL (ref 0.2–0.5)
Beta Globulin: 0.5 g/dL (ref 0.4–0.6)
Gamma Globulin: 0.7 g/dL — ABNORMAL LOW (ref 0.8–1.7)
Immunofix Electr Int: NOT DETECTED
Total Protein: 6.3 g/dL (ref 6.1–8.1)

## 2019-08-13 LAB — TIER 1
Chromatin (Nucleosomal) Antibody: <1 AI
ENA SM Ab Ser-aCnc: <1 AI
Ribonucleic Protein(ENA) Antibody, IgG: <1 AI
SM/RNP: <1 AI
ds DNA Ab: 2 IU/mL

## 2019-08-13 LAB — COMPLETE METABOLIC PANEL WITH GFR
AG Ratio: 1.7 (calc) (ref 1.0–2.5)
ALT: 24 U/L (ref 9–46)
AST: 36 U/L — ABNORMAL HIGH (ref 10–35)
Albumin: 4 g/dL (ref 3.6–5.1)
Alkaline phosphatase (APISO): 49 U/L (ref 35–144)
BUN: 9 mg/dL (ref 7–25)
CO2: 25 mmol/L (ref 20–32)
Calcium: 10 mg/dL (ref 8.6–10.3)
Chloride: 99 mmol/L (ref 98–110)
Creat: 1.1 mg/dL (ref 0.70–1.33)
GFR, Est African American: 90 mL/min/{1.73_m2} (ref >=60)
GFR, Est Non African American: 78 mL/min/{1.73_m2} (ref >=60)
Globulin: 2.4 g/dL (calc) (ref 1.9–3.7)
Glucose, Bld: 109 mg/dL — ABNORMAL HIGH (ref 65–99)
Potassium: 4.5 mmol/L (ref 3.5–5.3)
Sodium: 140 mmol/L (ref 135–146)
Total Bilirubin: 1.5 mg/dL — ABNORMAL HIGH (ref 0.2–1.2)
Total Protein: 6.4 g/dL (ref 6.1–8.1)

## 2019-08-13 LAB — IMMUNOFIXATION, SERUM

## 2019-08-13 LAB — IMMUNOFIXATION, URINE

## 2019-08-13 LAB — CBC WITH DIFFERENTIAL/PLATELET
Absolute Monocytes: 783 cells/uL (ref 200–950)
Basophils Absolute: 84 cells/uL (ref 0–200)
Basophils Relative: 1.1 %
Eosinophils Absolute: 220 cells/uL (ref 15–500)
Eosinophils Relative: 2.9 %
HCT: 53.4 % — ABNORMAL HIGH (ref 38.5–50.0)
Hemoglobin: 17.9 g/dL — ABNORMAL HIGH (ref 13.2–17.1)
Lymphs Abs: 1208 cells/uL (ref 850–3900)
MCH: 30.6 pg (ref 27.0–33.0)
MCHC: 33.5 g/dL (ref 32.0–36.0)
MCV: 91.3 fL (ref 80.0–100.0)
MPV: 10.4 fL (ref 7.5–12.5)
Monocytes Relative: 10.3 %
Neutro Abs: 5305 cells/uL (ref 1500–7800)
Neutrophils Relative %: 69.8 %
Platelets: 241 10*3/uL (ref 140–400)
RBC: 5.85 10*6/uL — ABNORMAL HIGH (ref 4.20–5.80)
RDW: 13 % (ref 11.0–15.0)
Total Lymphocyte: 15.9 %
WBC: 7.6 10*3/uL (ref 3.8–10.8)

## 2019-08-13 LAB — PROTEIN,TOTAL AND PROTEIN ELECTROPHORESIS, RANDOM URINE(REFL)
Albumin: 17 %
Alpha-1-Globulin, U: 6 %
Alpha-2-Globulin, U: 26 %
Beta Globulin, U: 33 %
Creatinine, Urine: 69 mg/dL (ref 20–320)
Gamma Globulin, U: 18 %
Protein/Creat Ratio: 275 mg/g creat — ABNORMAL HIGH (ref 22–128)
Protein/Creatinine Ratio: 0.275 mg/mg creat — ABNORMAL HIGH (ref 0.022–0.12)
Total Protein, Urine: 19 mg/dL (ref 5–25)

## 2019-08-13 LAB — PROTEIN,TOTAL AND ELECT AND IFE,24
Albumin: 17 %
Alpha-1-Globulin, U: 6 %
Alpha-2-Globulin, U: 26 %
Beta Globulin, U: 33 %
Creatinine, 24H Ur: 69 mg/dL
Gamma Globulin, U: 18 %
PROTEIN/CREATININE RATIO: 0.275 mg/mg creat — ABNORMAL HIGH (ref <=0.1)
PROTEIN/CREATININE RATIO: 275 mg/g creat — ABNORMAL HIGH (ref <=114)
Protein, 24H Urine: 19 mg/dL

## 2019-08-13 LAB — ANA SCREEN,IFA,REFLEX TITER/PATTERN,REFLEX MPLX 11 AB CASCADE
14-3-3 eta Protein: <0.2 ng/mL (ref <0.2)
Anti Nuclear Antibody (ANA): POSITIVE — AB
Cyclic Citrullin Peptide Ab: <16 UNITS
Rheumatoid fact SerPl-aCnc: <14 IU/mL (ref <14)

## 2019-08-13 LAB — SEDIMENTATION RATE: Sed Rate: 2 mm/h (ref 0–15)

## 2019-08-13 LAB — TIER 3
Centromere Ab Screen: <1 AI
Ribosomal P Protein Ab: <1 AI

## 2019-08-13 LAB — CK TOTAL AND CKMB (NOT AT ARMC): Total CK: 1181 U/L — ABNORMAL HIGH (ref 44–196)

## 2019-08-13 LAB — INTERPRETATION

## 2019-08-13 LAB — TIER 2
Jo-1 Autoabs: <1 AI
SSA (Ro) (ENA) Antibody, IgG: <1 AI
SSB (La) (ENA) Antibody, IgG: <1 AI
Scleroderma (Scl-70) (ENA) Antibody, IgG: <1 AI

## 2019-08-13 LAB — URIC ACID: Uric Acid, Serum: 2.5 mg/dL — ABNORMAL LOW (ref 4.0–8.0)

## 2019-08-13 LAB — ANTI-NUCLEAR AB-TITER (ANA TITER): ANA Titer 1: 1:80 {titer} — ABNORMAL HIGH

## 2019-08-13 NOTE — Progress Notes (Signed)
   Subjective:    Patient ID: Brandon Phelps, male    DOB: Feb 29, 1966, 53 y.o.   MRN: [id number]  HPI    Review of Systems     Objective:   Physical Exam        Assessment & Plan:  Spoke with the patient's neurosurgeon, Dr. 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  ANA is weakly positive but CRP and sed rate are normal.  The remainder of his lab work is normal.  Called and left voice message for the patient to stay off his statin and recheck a CK level in 2 weeks.  Push fluids and monitor for dark urine or worsening myalgias which would prompt immediate evaluation.  Left a phone message for the patient to call me back.

## 2019-08-25 ENCOUNTER — Other Ambulatory Visit: Payer: Managed Care, Other (non HMO)

## 2019-08-25 DIAGNOSIS — T466X5A Adverse effect of antihyperlipidemic and antiarteriosclerotic drugs, initial encounter: Secondary | ICD-10-CM

## 2019-08-25 DIAGNOSIS — R748 Abnormal levels of other serum enzymes: Secondary | ICD-10-CM

## 2019-08-26 LAB — CK: Total CK: 225 U/L — ABNORMAL HIGH (ref 44–196)

## 2019-08-26 LAB — BASIC METABOLIC PANEL
BUN: 16 mg/dL (ref 7–25)
CO2: 29 mmol/L (ref 20–32)
Calcium: 10 mg/dL (ref 8.6–10.3)
Chloride: 102 mmol/L (ref 98–110)
Creat: 1.31 mg/dL (ref 0.70–1.33)
Glucose, Bld: 65 mg/dL (ref 65–99)
Potassium: 4.3 mmol/L (ref 3.5–5.3)
Sodium: 141 mmol/L (ref 135–146)

## 2019-08-27 ENCOUNTER — Other Ambulatory Visit: Payer: Managed Care, Other (non HMO)

## 2019-09-08 ENCOUNTER — Encounter: Payer: Self-pay | Admitting: Family Medicine

## 2019-09-20 ENCOUNTER — Other Ambulatory Visit: Payer: Managed Care, Other (non HMO)

## 2019-10-03 IMAGING — CT CT HEART MORP W/ CTA COR W/ SCORE W/ CA W/CM &/OR W/O CM
4 of 7 series · 8 of 20 positions shown, 9 images · IV contrast (APPLIED)
Comparison: None.

Addendum:
EXAM:
OVER-READ INTERPRETATION  CT CHEST

The following report is an over-read performed by radiologist Dr.
M-Paule Zafirovic [REDACTED] on 10/07/2018. This
over-read does not include interpretation of cardiac or coronary
anatomy or pathology. The coronary CTA interpretation by the
cardiologist is attached.
CLINICAL DATA: 52M with hyperlipidemia, family history of premature
CAD and atypical chest pain.
Cardiac/Coronary  CT
TECHNIQUE: The patient was scanned on a Phillips Force scanner.

[Series 6: best diast 72 % · axial · 0.41mm/px · z∈[+1255,+1304]mm · 2 of 372 slices shown, 3 images]
[im 124/372  vessel]
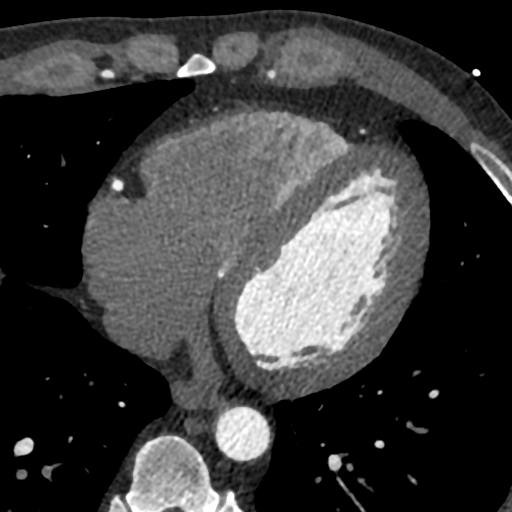
[im 124/372  lung]
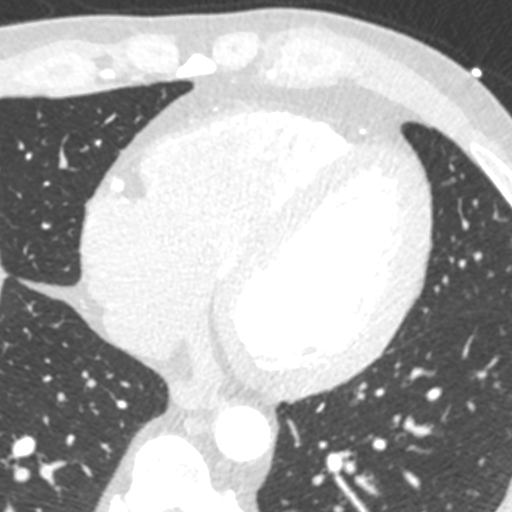
[im 248/372  vessel]
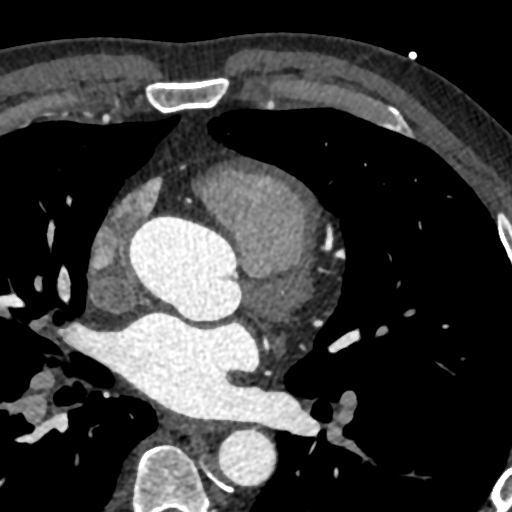

[Series 7: best syst 34 % · axial · 0.41mm/px · z∈[+1255,+1304]mm · 2 of 372 slices shown]
[im 124/372  vessel]
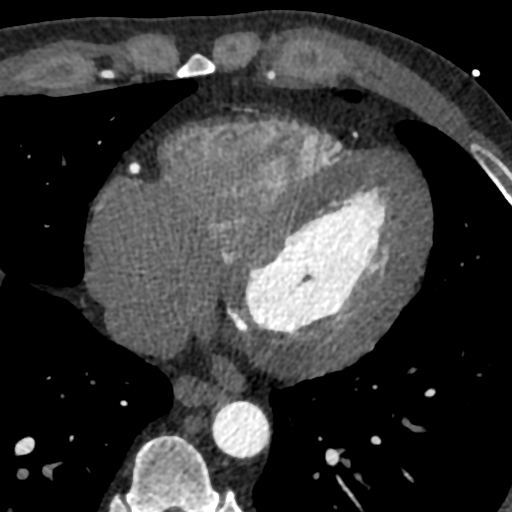
[im 248/372  vessel]
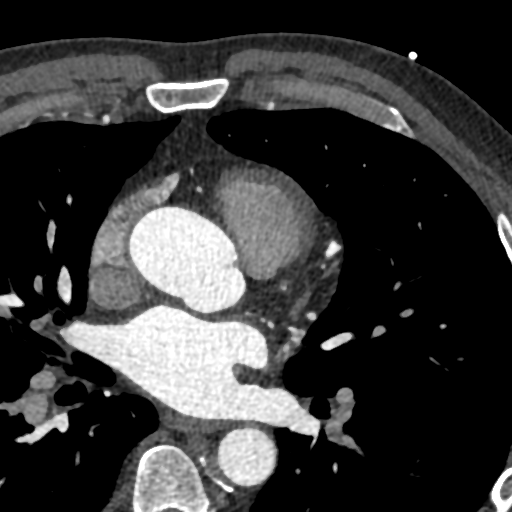

[Series 8: ts diast sharp 72 % · axial · 0.41mm/px · z∈[+1255,+1304]mm · 2 of 372 slices shown]
[im 124/372  lung]
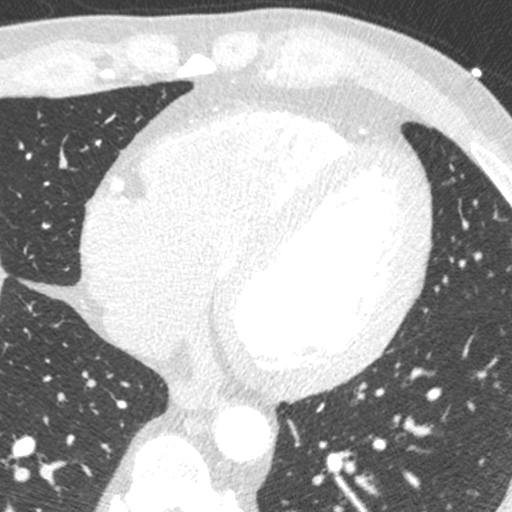
[im 248/372  lung]
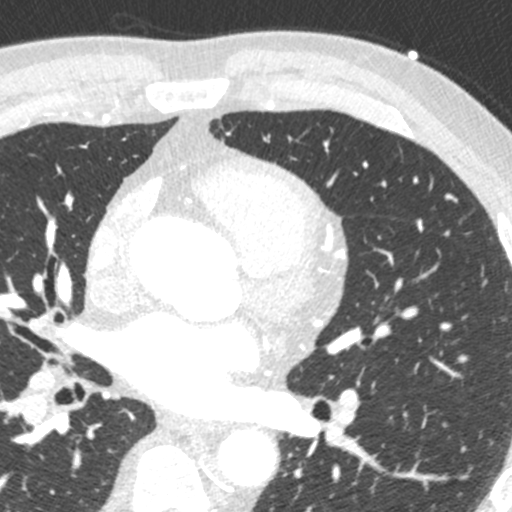

[Series 9: ts syst sharp 34 % · axial · 0.41mm/px · z∈[+1255,+1304]mm · 2 of 372 slices shown]
[im 124/372  lung]
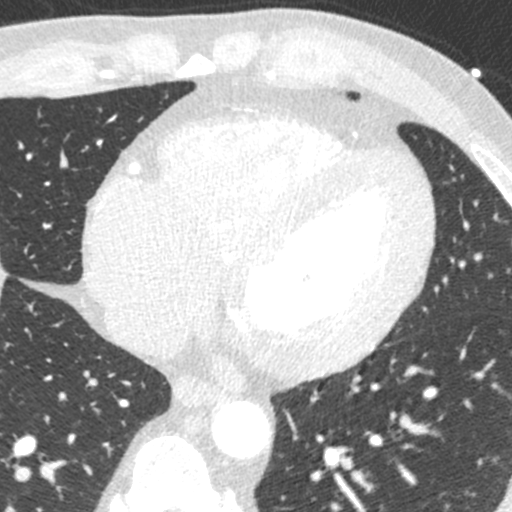
[im 248/372  lung]
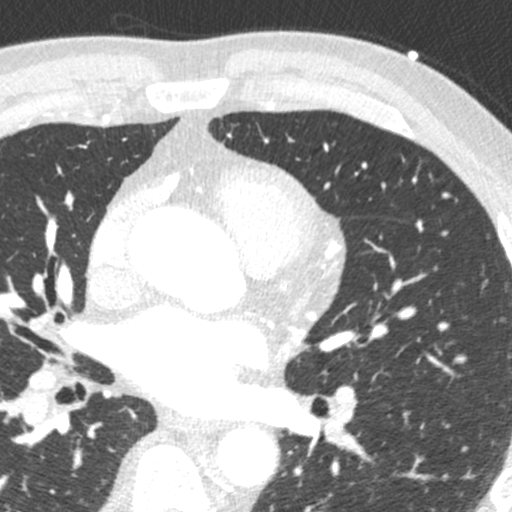

[8 of 20 positions shown; findings below may reference images not displayed]

FINDINGS: Vascular: Mild dilatation of the ascending thoracic aorta measuring
approximately 4.1-4.2 cm in greatest diameter.

Mediastinum/Nodes: Visualized mediastinum and hilar regions show no
evidence of lymphadenopathy or masses.

Lungs/Pleura: Visualized lungs show no evidence of pulmonary edema,
consolidation, pneumothorax, nodule or pleural fluid.

Upper Abdomen: No acute abnormality.

Musculoskeletal: No chest wall mass or suspicious bone lesions
identified.
IMPRESSION: Mild aneurysmal disease of the visualized ascending thoracic aorta
measuring approximately 4.1-4.2 cm in greatest diameter. Recommend
annual imaging followup by CTA or MRA. This recommendation follows
5363 ACCF/AHA/AATS/ACR/ASA/SCA/IKENNA/ALESHA/RUIZ DIAZ/SAMSONAITE Guidelines for the
Diagnosis and Management of Patients with Thoracic Aortic Disease.
Circulation. 5363; 121: E266-e369. Aortic aneurysm NOS ([id number])
FINDINGS: A 120 kV prospective scan was triggered in the descending thoracic
aorta at 111 HU's. Axial non-contrast 3 mm slices were carried out
through the heart. The data set was analyzed on a dedicated work
station and scored using the Agatson method. Gantry rotation speed
was 250 msecs and collimation was .6 mm. No beta blockade and 0.8 mg
of sl NTG was given. The 3D data set was reconstructed in 5%
intervals of the 67-82 % of the R-R cycle. Diastolic phases were
analyzed on a dedicated work station using MPR, MIP and VRT modes.
The patient received 80 cc of contrast.

Aorta: Ascending aorta mildly dilated. No calcifications. No
dissection.

Aortic Valve:  Trileaflet.  No calcifications.

Coronary Arteries:  Normal coronary origin.  Right dominance.

RCA is a large dominant artery that gives rise to PDA and two SASSI
branches. There is no plaque.

Left main is a large artery that gives rise to LAD, LCX, and RI
arteries.

LAD is a large vessel that has no plaque.

LCX is a small, non-dominant artery.  There is no plaque.

RI has no plaque.  The is myocardial bridging in the mid RI.

Other findings:

Normal pulmonary vein drainage into the left atrium.

Normal let atrial appendage without a thrombus.

Normal size of the pulmonary artery.
IMPRESSION: 1. Coronary calcium score of 0. This was 0 percentile for age and
sex matched control.

2. Normal coronary origin with right dominance.

3. No evidence of CAD.

4.  Myocardial bridging is noted in the mid RI.

*** End of Addendum ***

## 2019-11-18 NOTE — Progress Notes (Signed)
Cardiology Office Note:    Date:  11/19/2019   ID:  Brandon Phelps, DOB [birth year], MRN [id number]  PCP:  Susy Frizzle, MD  Cardiologist:  Shirlee More, MD    Referring MD: Susy Frizzle, MD    ASSESSMENT:    1. Coronary-myocardial bridge   2. Enlarged thoracic aorta ([place])   3. Essential hypertension    PLAN:    In order of problems listed above:  1. Stable asymptomatic he does not have obstructive CAD and I would not put him back on a statin 2. Check CT scan unless he has evidence significant progression would not need to consider intervention 3. Stable controlled he needs a primary care referral and I will send him to the group in my building 4. Polycythemia stable managed by oncology   Next appointment: 1 year   Medication Adjustments/Labs and Tests Ordered: Current medicines are reviewed at length with the patient today.  Concerns regarding medicines are outlined above.  No orders of the defined types were placed in this encounter.  No orders of the defined types were placed in this encounter.   Chief Complaint  Patient presents with  . Follow-up    Coronary myocardial bridge enlargement thoracic aorta    History of Present Illness:    Brandon Phelps is a 54 y.o. male with a hx of large mid ascending aorta 41 to 42 mm CT scan 10/07/2018 and myocardial bridge on cardiac CTA.  Months last seen 10/20/2018. 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  He developed myositis had an elevated CK and is off of the statin.  Renal function was normal August 21, 2015 2021 creatinine 1.31.  He has had no angina shortness of breath palpitation or syncope we will plan on doing CT or enlargement his thoracic aorta. Past Medical History:  Diagnosis Date  . Abscess    lt thigh  . Hyperlipidemia   . Hypertension     Past Surgical History:  Procedure Laterality Date  .  IRRIGATION AND DEBRIDEMENT ABSCESS     lt thigh  . SPINE SURGERY  Cervical Fusion    Current Medications: Current Meds  Medication Sig  . clobetasol (OLUX) 0.05 % topical foam APPLY 1 GRAM ONTO THE SKIN AS DIRECTED  . nitroGLYCERIN (NITROSTAT) 0.4 MG SL tablet Place 1 tablet (0.4 mg total) under the tongue every 5 (five) minutes as needed for chest pain.  . Omega-3 Fatty Acids (FISH OIL) 1000 MG CAPS omega-3 acid ethyl esters 1 gram capsule  . pantoprazole (PROTONIX) 40 MG tablet Take 1 tablet (40 mg total) by mouth daily.     Allergies:   Other   Social History   Socioeconomic History  . Marital status: Married    Spouse name: Not on file  . Number of children: Not on file  . Years of education: Not on file  . Highest education level: Not on file  Occupational History  . Not on file  Tobacco Use  . Smoking status: Never Smoker  . Smokeless tobacco: Never Used  Substance and Sexual Activity  . Alcohol use: No  . Drug use: No  . Sexual activity: Not on file  Other Topics Concern  . Not on file  Social History Narrative  . Not on file   Social Determinants of Health   Financial Resource Strain:   . Difficulty of Paying Living Expenses:   Food  Insecurity:   . Worried About Charity fundraiser in the Last Year:   . Arboriculturist in the Last Year:   Transportation Needs:   . Film/video editor (Medical):   Marland Kitchen Lack of Transportation (Non-Medical):   Physical Activity:   . Days of Exercise per Week:   . Minutes of Exercise per Session:   Stress:   . Feeling of Stress :   Social Connections:   . Frequency of Communication with Friends and Family:   . Frequency of Social Gatherings with Friends and Family:   . Attends Religious Services:   . Active Member of Clubs or Organizations:   . Attends Archivist Meetings:   Marland Kitchen Marital Status:      Family History: The patient's family history includes Heart attack in his maternal uncle and mother;  Hyperlipidemia in his father; Hypertension in his father; Lung cancer in his maternal grandfather. ROS:   Please see the history of present illness.    All other systems reviewed and are negative.  EKGs/Labs/Other Studies Reviewed:    The following studies were reviewed today:  EKG:  EKG ordered today and personally reviewed.  The ekg ordered today demonstrates sinus rhythm normal  Recent Labs: 08/09/2019: ALT 24; Hemoglobin 17.9; Platelets 241 08/25/2019: BUN 16; Creat 1.31; Potassium 4.3; Sodium 141  Recent Lipid Panel    Component Value Date/Time   CHOL 135 09/08/2018 0937   TRIG 58 09/08/2018 0937   HDL 43 09/08/2018 0937   CHOLHDL 3.1 09/08/2018 0937   CHOLHDL 4.9 05/30/2015 0826   VLDL 19 05/30/2015 0826   LDLCALC 80 09/08/2018 0937    Physical Exam:    VS:  BP 122/84   Pulse 82   Temp 98 F (36.7 C)   Ht 6\' 1"  (1.854 m)   Wt 225 lb 1.3 oz (102.1 kg)   SpO2 97%   BMI 29.70 kg/m     Wt Readings from Last 3 Encounters:  11/19/19 225 lb 1.3 oz (102.1 kg)  10/23/18 231 lb 12.8 oz (105.1 kg)  10/20/18 232 lb (105.2 kg)     GEN:  Well nourished, well developed in no acute distress HEENT: Normal NECK: No JVD; No carotid bruits LYMPHATICS: No lymphadenopathy CARDIAC: RRR, no murmurs, rubs, gallops RESPIRATORY:  Clear to auscultation without rales, wheezing or rhonchi  ABDOMEN: Soft, non-tender, non-distended MUSCULOSKELETAL:  No edema; No deformity  SKIN: Warm and dry NEUROLOGIC:  Alert and oriented x 3 PSYCHIATRIC:  Normal affect    Signed, Shirlee More, MD  11/19/2019 8:46 AM    [place]

## 2019-11-19 ENCOUNTER — Other Ambulatory Visit: Payer: Self-pay

## 2019-11-19 ENCOUNTER — Encounter: Payer: Self-pay | Admitting: Cardiology

## 2019-11-19 ENCOUNTER — Ambulatory Visit: Payer: Managed Care, Other (non HMO) | Admitting: Cardiology

## 2019-11-19 VITALS — BP 122/84 | HR 82 | Temp 98.0°F | Ht 73.0 in | Wt 225.1 lb

## 2019-11-19 DIAGNOSIS — I10 Essential (primary) hypertension: Secondary | ICD-10-CM | POA: Diagnosis not present

## 2019-11-19 DIAGNOSIS — Z789 Other specified health status: Secondary | ICD-10-CM

## 2019-11-19 DIAGNOSIS — I7789 Other specified disorders of arteries and arterioles: Secondary | ICD-10-CM

## 2019-11-19 DIAGNOSIS — Q245 Malformation of coronary vessels: Secondary | ICD-10-CM | POA: Diagnosis not present

## 2019-11-19 DIAGNOSIS — D45 Polycythemia vera: Secondary | ICD-10-CM | POA: Diagnosis not present

## 2019-11-19 NOTE — Patient Instructions (Signed)
Medication Instructions:  Your physician recommends that you continue on your current medications as directed. Please refer to the Current Medication list given to you today.  *If you need a refill on your cardiac medications before your next appointment, please call your pharmacy*   Lab Work: TODAY:  BMET  If you have labs (blood work) drawn today and your tests are completely normal, you will receive your results only by: Marland Kitchen MyChart Message (if you have MyChart) OR . A paper copy in the mail If you have any lab test that is abnormal or we need to change your treatment, we will call you to review the results.   Testing/Procedures: Non-Cardiac CT Angiography (CTA), is a special type of CT scan that uses a computer to produce multi-dimensional views of major blood vessels throughout the body. In CT angiography, a contrast material is injected through an IV to help visualize the blood vessels    Follow-Up: At [facility], you and your health needs are our priority.  As part of our continuing mission to provide you with exceptional heart care, we have created designated Provider Care Teams.  These Care Teams include your primary Cardiologist (physician) and Advanced Practice Providers (APPs -  Physician Assistants and Nurse Practitioners) who all work together to provide you with the care you need, when you need it.  We recommend signing up for the patient portal called "MyChart".  Sign up information is provided on this After Visit Summary.  MyChart is used to connect with patients for Virtual Visits (Telemedicine).  Patients are able to view lab/test results, encounter notes, upcoming appointments, etc.  Non-urgent messages can be sent to your provider as well.   To learn more about what you can do with MyChart, go to [email or web address].    Your next appointment:   12 month(s)  The format for your next appointment:   In Person  Provider:   Shirlee More, MD   Other  Instructions Pt needs an appointment as a new pt for [facility] downstairs in [place]

## 2020-03-08 ENCOUNTER — Telehealth: Payer: Self-pay | Admitting: Cardiology

## 2020-03-08 ENCOUNTER — Telehealth: Payer: Self-pay | Admitting: Family Medicine

## 2020-03-08 ENCOUNTER — Telehealth: Payer: Self-pay | Admitting: Hematology and Oncology

## 2020-03-08 ENCOUNTER — Telehealth: Payer: Self-pay | Admitting: *Deleted

## 2020-03-08 NOTE — Telephone Encounter (Signed)
TCT back to patient and spoke with him. Advised that we can make an appt with Dr, Lorenso Courier on Friday 03/10/20 with labs at 11am.   Pt asked if he could have a phlebotomy that day as welll. Advised that the infusion room is completely booked but it could be arranged after he returns from his vacation.   Pt states he just wants lab work done and a phlebotomy. Advised again that he would need to be seen by the new provider before those appts could be made. Pt states he does not want those appts and will call his cardiologist to see if the labs and phlebotomy can be done there. Again advised that we can certainly see him this week but the phlebotomy would need to be when he returns from his vacation as he is leaving 03/10/20 in the evening. Pt declined that option.

## 2020-03-08 NOTE — Telephone Encounter (Signed)
New message   Patient wants to have blood work drawn. Please call to discuss.

## 2020-03-08 NOTE — Telephone Encounter (Signed)
Spoke to the patient just now and he let me know that he was having to switch Dr's over at the cancer center because Dr. Walden Field had retired. He states that he has been going there for some time to have blood taken off of him because he has polycythemia. He states that now that Dr. Walden Field has retired he is having to begin seeing another new doctor who may want him to have more testing and things done that he does not see as necessary. He requests that Dr. Bettina Gavia call them to ask them to "Just draw my labs and take blood off if necessary".   I spoke with Dr. Bettina Gavia in regards to this and he let me know that he would not be able to call over to the cancer center to do this. He did not feel that it was his place to tell them what to do within their practice. The patient will need to get established with this new doctor to continue his care.   Upon hearing this the patient got upset and was unhappy with this answer. He states that he does not think that Dr. Bettina Gavia understands and I assured him that he did but he just was not able to tell them what to do at this time. I was going to offer to see if we could refer the patient somewhere else if he would like that but he hung up before I could discuss this with him any further.

## 2020-03-08 NOTE — Telephone Encounter (Signed)
CB# [phone] Pt would like to know if he can stop by have labs drawn check is hemoglobin reason for thick blood-bad blood

## 2020-03-08 NOTE — Telephone Encounter (Signed)
Scheduled apt per 6/30 sch msg - no answer. Left message with appt date and time

## 2020-03-08 NOTE — Telephone Encounter (Signed)
Received call from pt requesting to have lab work done.  He has Polycythemia  Vera.  He is a former pt of Dr. Walden Field and has not been seen since March 2020.  Advised that Dr. Walden Field is no longer a provider here and advised that we would arrange for him to be seen by another provider, Dr. Lorenso Courier. And then have labs done.  Pt states he needs to have labs done by Friday as he is going out of town on vacation Friday evening, 03/10/20 Advised that likely Dr. Lorenso Courier would need to see him first and then order labs. Pt states he only wants lab work done. Advised that he has to have a provider here before labs can be ordered. Pt states he feels like he is symptomatic again from his PV and may need phlebotomies. Again, pt was reminded that he would need to establish with a provider in order to have these things ordered.  Advised  that I would talk to Dr. Lorenso Courier and review his schedule to see if we can see him either tomorrow or Friday.  Advised that I would call him back later today.  Pt. Voiced understanding

## 2020-03-14 NOTE — Telephone Encounter (Signed)
Pt is on vaca, he will make appt once he returns.

## 2020-03-17 ENCOUNTER — Other Ambulatory Visit: Payer: Managed Care, Other (non HMO)

## 2020-03-17 ENCOUNTER — Ambulatory Visit: Payer: Managed Care, Other (non HMO) | Admitting: Hematology and Oncology

## 2020-03-27 ENCOUNTER — Encounter: Payer: Self-pay | Admitting: Family Medicine

## 2020-03-27 ENCOUNTER — Other Ambulatory Visit: Payer: Self-pay

## 2020-03-27 ENCOUNTER — Other Ambulatory Visit: Payer: Managed Care, Other (non HMO)

## 2020-03-27 DIAGNOSIS — E785 Hyperlipidemia, unspecified: Secondary | ICD-10-CM

## 2020-03-27 DIAGNOSIS — D45 Polycythemia vera: Secondary | ICD-10-CM

## 2020-03-27 DIAGNOSIS — Q245 Malformation of coronary vessels: Secondary | ICD-10-CM

## 2020-03-27 DIAGNOSIS — I10 Essential (primary) hypertension: Secondary | ICD-10-CM

## 2020-03-28 ENCOUNTER — Other Ambulatory Visit: Payer: Managed Care, Other (non HMO)

## 2020-03-28 DIAGNOSIS — Q245 Malformation of coronary vessels: Secondary | ICD-10-CM

## 2020-03-28 DIAGNOSIS — I10 Essential (primary) hypertension: Secondary | ICD-10-CM

## 2020-03-28 DIAGNOSIS — D45 Polycythemia vera: Secondary | ICD-10-CM

## 2020-03-28 DIAGNOSIS — E785 Hyperlipidemia, unspecified: Secondary | ICD-10-CM

## 2020-03-28 NOTE — Telephone Encounter (Signed)
I am fine with him coming by for labs.  I believe he needs a cbc, cmp, and flp.  Is there any other lab he needs?

## 2020-03-29 ENCOUNTER — Other Ambulatory Visit: Payer: Managed Care, Other (non HMO)

## 2020-03-29 LAB — COMPLETE METABOLIC PANEL WITH GFR
AG Ratio: 1.9 (calc) (ref 1.0–2.5)
ALT: 17 U/L (ref 9–46)
AST: 26 U/L (ref 10–35)
Albumin: 4.1 g/dL (ref 3.6–5.1)
Alkaline phosphatase (APISO): 37 U/L (ref 35–144)
BUN/Creatinine Ratio: 8 (calc) (ref 6–22)
BUN: 13 mg/dL (ref 7–25)
CO2: 30 mmol/L (ref 20–32)
Calcium: 9.1 mg/dL (ref 8.6–10.3)
Chloride: 99 mmol/L (ref 98–110)
Creat: 1.57 mg/dL — ABNORMAL HIGH (ref 0.70–1.33)
GFR, Est African American: 58 mL/min/{1.73_m2} — ABNORMAL LOW (ref >=60)
GFR, Est Non African American: 50 mL/min/{1.73_m2} — ABNORMAL LOW (ref >=60)
Globulin: 2.2 g/dL (calc) (ref 1.9–3.7)
Glucose, Bld: 75 mg/dL (ref 65–99)
Potassium: 4.7 mmol/L (ref 3.5–5.3)
Sodium: 137 mmol/L (ref 135–146)
Total Bilirubin: 0.9 mg/dL (ref 0.2–1.2)
Total Protein: 6.3 g/dL (ref 6.1–8.1)

## 2020-03-29 LAB — LIPID PANEL
Cholesterol: 235 mg/dL — ABNORMAL HIGH (ref <200)
HDL: 22 mg/dL — ABNORMAL LOW (ref >=40)
LDL Cholesterol (Calc): 188 mg/dL (calc) — ABNORMAL HIGH
Non-HDL Cholesterol (Calc): 213 mg/dL (calc) — ABNORMAL HIGH (ref <130)
Total CHOL/HDL Ratio: 10.7 (calc) — ABNORMAL HIGH (ref <5.0)
Triglycerides: 119 mg/dL (ref <150)

## 2020-03-29 LAB — CBC WITH DIFFERENTIAL/PLATELET
Absolute Monocytes: 864 cells/uL (ref 200–950)
Basophils Absolute: 88 cells/uL (ref 0–200)
Basophils Relative: 1.3 %
Eosinophils Absolute: 129 cells/uL (ref 15–500)
Eosinophils Relative: 1.9 %
HCT: 50.3 % — ABNORMAL HIGH (ref 38.5–50.0)
Hemoglobin: 16.7 g/dL (ref 13.2–17.1)
Lymphs Abs: 1816 cells/uL (ref 850–3900)
MCH: 30.9 pg (ref 27.0–33.0)
MCHC: 33.2 g/dL (ref 32.0–36.0)
MCV: 93 fL (ref 80.0–100.0)
MPV: 10.3 fL (ref 7.5–12.5)
Monocytes Relative: 12.7 %
Neutro Abs: 3903 cells/uL (ref 1500–7800)
Neutrophils Relative %: 57.4 %
Platelets: 276 10*3/uL (ref 140–400)
RBC: 5.41 10*6/uL (ref 4.20–5.80)
RDW: 13 % (ref 11.0–15.0)
Total Lymphocyte: 26.7 %
WBC: 6.8 10*3/uL (ref 3.8–10.8)

## 2020-03-31 ENCOUNTER — Ambulatory Visit (INDEPENDENT_AMBULATORY_CARE_PROVIDER_SITE_OTHER)
Admission: RE | Admit: 2020-03-31 | Discharge: 2020-03-31 | Disposition: A | Discharge status: Home / Self Care | Payer: Managed Care, Other (non HMO) | Source: Ambulatory Visit | Attending: Cardiology | Admitting: Cardiology

## 2020-03-31 ENCOUNTER — Other Ambulatory Visit: Payer: Self-pay

## 2020-03-31 DIAGNOSIS — I7789 Other specified disorders of arteries and arterioles: Secondary | ICD-10-CM | POA: Diagnosis not present

## 2020-03-31 DIAGNOSIS — I10 Essential (primary) hypertension: Secondary | ICD-10-CM | POA: Diagnosis not present

## 2020-03-31 DIAGNOSIS — Q245 Malformation of coronary vessels: Secondary | ICD-10-CM | POA: Diagnosis not present

## 2020-03-31 MED ORDER — IOHEXOL 350 MG/ML SOLN
100.0000 mL | Freq: Once | INTRAVENOUS | Status: AC | PRN
  Administered 2020-03-31: 100 mL via INTRAVENOUS

## 2020-04-03 ENCOUNTER — Telehealth: Payer: Self-pay

## 2020-04-03 NOTE — Telephone Encounter (Signed)
-----   Message from Richardo Priest, MD sent at 04/03/2020  9:47 AM EDT ----- Normal or stable result  Caliber is aorta is unchanged this is a good result.

## 2020-04-03 NOTE — Telephone Encounter (Signed)
Left message on patients voicemail to please return our call.   

## 2020-04-03 NOTE — Telephone Encounter (Signed)
Spoke with patient regarding results and recommendation.  Patient verbalizes understanding and is agreeable to plan of care. Advised patient to call back with any issues or concerns.  

## 2021-05-02 DIAGNOSIS — M25521 Pain in right elbow: Secondary | ICD-10-CM | POA: Insufficient documentation

## 2021-09-12 DIAGNOSIS — M79671 Pain in right foot: Secondary | ICD-10-CM | POA: Insufficient documentation

## 2021-09-24 ENCOUNTER — Emergency Department (HOSPITAL_COMMUNITY)
Admission: EM | Admit: 2021-09-24 | Discharge: 2021-09-25 | Disposition: A | Discharge status: Home / Self Care | Payer: Managed Care, Other (non HMO) | Attending: Emergency Medicine | Admitting: Emergency Medicine

## 2021-09-24 ENCOUNTER — Other Ambulatory Visit: Payer: Self-pay

## 2021-09-24 ENCOUNTER — Encounter (HOSPITAL_COMMUNITY): Payer: Self-pay | Admitting: Emergency Medicine

## 2021-09-24 ENCOUNTER — Emergency Department (HOSPITAL_COMMUNITY): Payer: Managed Care, Other (non HMO)

## 2021-09-24 DIAGNOSIS — Z79899 Other long term (current) drug therapy: Secondary | ICD-10-CM | POA: Diagnosis not present

## 2021-09-24 DIAGNOSIS — I10 Essential (primary) hypertension: Secondary | ICD-10-CM | POA: Diagnosis not present

## 2021-09-24 DIAGNOSIS — Z5321 Procedure and treatment not carried out due to patient leaving prior to being seen by health care provider: Secondary | ICD-10-CM | POA: Insufficient documentation

## 2021-09-24 DIAGNOSIS — R0789 Other chest pain: Secondary | ICD-10-CM | POA: Insufficient documentation

## 2021-09-24 LAB — CBC
HCT: 48.9 % (ref 39.0–52.0)
Hemoglobin: 16.3 g/dL (ref 13.0–17.0)
MCH: 30.7 pg (ref 26.0–34.0)
MCHC: 33.3 g/dL (ref 30.0–36.0)
MCV: 92.1 fL (ref 80.0–100.0)
Platelets: 240 10*3/uL (ref 150–400)
RBC: 5.31 MIL/uL (ref 4.22–5.81)
RDW: 15 % (ref 11.5–15.5)
WBC: 7.6 10*3/uL (ref 4.0–10.5)
nRBC: 0 % (ref 0.0–0.2)

## 2021-09-24 LAB — BASIC METABOLIC PANEL
Anion gap: 10 (ref 5–15)
BUN: 13 mg/dL (ref 6–20)
CO2: 23 mmol/L (ref 22–32)
Calcium: 8.8 mg/dL — ABNORMAL LOW (ref 8.9–10.3)
Chloride: 103 mmol/L (ref 98–111)
Creatinine, Ser: 1.43 mg/dL — ABNORMAL HIGH (ref 0.61–1.24)
GFR, Estimated: 59 mL/min — ABNORMAL LOW (ref >60)
Glucose, Bld: 92 mg/dL (ref 70–99)
Potassium: 4.3 mmol/L (ref 3.5–5.1)
Sodium: 136 mmol/L (ref 135–145)

## 2021-09-24 LAB — TROPONIN I (HIGH SENSITIVITY)
Troponin I (High Sensitivity): 16 ng/L (ref <18)
Troponin I (High Sensitivity): 25 ng/L — ABNORMAL HIGH (ref <18)

## 2021-09-24 NOTE — ED Triage Notes (Signed)
Pt here for chest discomfort, generalized, nonradiating w/ some tightness w/ respirations. Pt had an episode yesterday, then another today after work. Denies N/V, dizziness, and shob.

## 2021-09-24 NOTE — ED Provider Triage Note (Signed)
Emergency Medicine Provider Triage Evaluation Note  Brandon Phelps , a 56 y.o. male  was evaluated in triage.  Pt complains of intermittent left-sided chest pain onset 2 days. Patient notes that his symptoms started 2 days ago while using a chainsaw he noted increased shortness of breath with exertion and left-sided chest pain.  Has not tried medications for symptoms.  Patient does have a cardiologist who follows him for his thoracic aortic aneurysm.  Denies fever, chills, abdominal pain, nausea, vomiting. Denies history of MI, CAD, diabetes, cardiac catheterization.  Patient has a history of hypertension treated with losartan.    Review of Systems  Positive: As per HPI above. Negative: Fever, chills  Physical Exam  BP (!) 172/104 (BP Location: Right Arm)    Pulse (!) 103    Temp 98.5 F (36.9 C) (Oral)    Resp 18    SpO2 95%  Gen:   Awake, no distress   Resp:  Normal effort  MSK:   Moves extremities without difficulty  Other:  No chest wall or abdominal tenderness to palpation  Medical Decision Making  Medically screening exam initiated at 7:40 PM.  Appropriate orders placed.  Brandon Phelps was informed that the remainder of the evaluation will be completed by another provider, this initial triage assessment does not replace that evaluation, and the importance of remaining in the ED until their evaluation is complete.   Brandon Phelps A, PA-C 09/24/21 1941

## 2021-09-25 ENCOUNTER — Emergency Department (HOSPITAL_BASED_OUTPATIENT_CLINIC_OR_DEPARTMENT_OTHER)
Admission: EM | Admit: 2021-09-25 | Discharge: 2021-09-25 | Disposition: A | Discharge status: Home / Self Care | Payer: Managed Care, Other (non HMO) | Source: Home / Self Care | Attending: Emergency Medicine | Admitting: Emergency Medicine

## 2021-09-25 ENCOUNTER — Telehealth: Payer: Self-pay | Admitting: Hematology

## 2021-09-25 ENCOUNTER — Emergency Department (HOSPITAL_BASED_OUTPATIENT_CLINIC_OR_DEPARTMENT_OTHER): Payer: Managed Care, Other (non HMO)

## 2021-09-25 ENCOUNTER — Encounter (HOSPITAL_BASED_OUTPATIENT_CLINIC_OR_DEPARTMENT_OTHER): Payer: Self-pay

## 2021-09-25 DIAGNOSIS — I10 Essential (primary) hypertension: Secondary | ICD-10-CM | POA: Insufficient documentation

## 2021-09-25 DIAGNOSIS — R0789 Other chest pain: Secondary | ICD-10-CM | POA: Insufficient documentation

## 2021-09-25 DIAGNOSIS — Z79899 Other long term (current) drug therapy: Secondary | ICD-10-CM | POA: Insufficient documentation

## 2021-09-25 DIAGNOSIS — R079 Chest pain, unspecified: Secondary | ICD-10-CM

## 2021-09-25 LAB — TROPONIN I (HIGH SENSITIVITY): Troponin I (High Sensitivity): 18 ng/L — ABNORMAL HIGH (ref <18)

## 2021-09-25 MED ORDER — IOHEXOL 350 MG/ML SOLN
75.0000 mL | Freq: Once | INTRAVENOUS | Status: AC | PRN
  Administered 2021-09-25: 75 mL via INTRAVENOUS

## 2021-09-25 MED ORDER — SODIUM CHLORIDE 0.9 % IV BOLUS
1000.0000 mL | Freq: Once | INTRAVENOUS | Status: AC
  Administered 2021-09-25: 1000 mL via INTRAVENOUS

## 2021-09-25 NOTE — ED Notes (Signed)
Patient states he is leaving. 

## 2021-09-25 NOTE — ED Triage Notes (Signed)
Pt presents to the ED from [facility] in relation to chest pain. Pt states that he is having no chest pain at this time. States that he had an episode earlier after work today. Pt A&Ox4 at time of triage. Pt states that he had blood drawn at [facility] and took a 325mg  of aspirin around 6pm today. Hx of thoracic aneurysm and polycythemia.

## 2021-09-25 NOTE — Telephone Encounter (Signed)
Scheduled appt per 1/17 referral. Spoke to pt who is aware of appt date and time.

## 2021-09-25 NOTE — ED Provider Notes (Signed)
[facility] EMERGENCY DEPT Provider Note   CSN: [id number] Arrival date & time: 09/25/21  0347     History  Chief Complaint  Patient presents with   Chest Pain    Brandon Phelps is a 56 y.o. male.  Patient presents to the emergency department for evaluation of chest discomfort.  Patient describes more of a shortness of breath and heaviness in his chest that occurred tonight.  He has not been feeling like himself for the last few days.  He does have a history of polycythemia vera and thinks that his blood counts may be going up.  Patient was seen at [facility] earlier tonight, left because of the long wait time in the waiting room.  He did have blood work performed.  At arrival here, he is pain-free and feels back to his normal self.   HPI: A 56 year old patient with a history of hypertension and hypercholesterolemia presents for evaluation of chest pain. Initial onset of pain was approximately 1-3 hours ago. The patient's chest pain is not worse with exertion. The patient's chest pain is middle- or left-sided, is not well-localized, is not described as heaviness/pressure/tightness, is not sharp and does not radiate to the arms/jaw/neck. The patient does not complain of nausea and denies diaphoresis. The patient has a family history of coronary artery disease in a first-degree relative with onset less than age 89. The patient has no history of stroke, has no history of peripheral artery disease, has not smoked in the past 90 days, denies any history of treated diabetes and does not have an elevated BMI (>=30).   Home Medications Prior to Admission medications   Medication Sig Start Date End Date Taking? Authorizing Provider  clobetasol (OLUX) 0.05 % topical foam APPLY 1 GRAM ONTO THE SKIN AS DIRECTED 06/23/19   Susy Frizzle, MD  losartan (COZAAR) 50 MG tablet Take 1 tablet (50 mg total) by mouth daily. Patient not taking: Reported on 11/19/2019 08/21/18   Susy Frizzle,  MD  nitroGLYCERIN (NITROSTAT) 0.4 MG SL tablet Place 1 tablet (0.4 mg total) under the tongue every 5 (five) minutes as needed for chest pain. 10/09/18 11/19/19  Richardo Priest, MD  Omega-3 Fatty Acids (FISH OIL) 1000 MG CAPS omega-3 acid ethyl esters 1 gram capsule    [provider]  pantoprazole (PROTONIX) 40 MG tablet Take 1 tablet (40 mg total) by mouth daily. 08/21/18   Susy Frizzle, MD      Allergies    Other    Review of Systems   Review of Systems  Respiratory:  Positive for shortness of breath.   Cardiovascular:  Positive for chest pain.   Physical Exam Updated Vital Signs BP 135/88    Pulse 67    Temp 98.8 F (37.1 C) (Oral)    Resp 17    Ht 6\' 1"  (1.854 m)    Wt 102.1 kg    SpO2 95%    BMI 29.69 kg/m  Physical Exam Vitals and nursing note reviewed.  Constitutional:      General: He is not in acute distress.    Appearance: Normal appearance. He is well-developed.  HENT:     Head: Normocephalic and atraumatic.     Right Ear: Hearing normal.     Left Ear: Hearing normal.     Nose: Nose normal.  Eyes:     Conjunctiva/sclera: Conjunctivae normal.     Pupils: Pupils are equal, round, and reactive to light.  Cardiovascular:  Rate and Rhythm: Regular rhythm.     Heart sounds: S1 normal and S2 normal. No murmur heard.   No friction rub. No gallop.  Pulmonary:     Effort: Pulmonary effort is normal. No respiratory distress.     Breath sounds: Normal breath sounds.  Chest:     Chest wall: No tenderness.  Abdominal:     General: Bowel sounds are normal.     Palpations: Abdomen is soft.     Tenderness: There is no abdominal tenderness. There is no guarding or rebound. Negative signs include Murphy's sign and McBurney's sign.     Hernia: No hernia is present.  Musculoskeletal:        General: Normal range of motion.     Cervical back: Normal range of motion and neck supple.  Skin:    General: Skin is warm and dry.     Findings: No rash.  Neurological:      Mental Status: He is alert and oriented to person, place, and time.     GCS: GCS eye subscore is 4. GCS verbal subscore is 5. GCS motor subscore is 6.     Cranial Nerves: No cranial nerve deficit.     Sensory: No sensory deficit.     Coordination: Coordination normal.  Psychiatric:        Speech: Speech normal.        Behavior: Behavior normal.        Thought Content: Thought content normal.    ED Results / Procedures / Treatments   Labs (all labs ordered are listed, but only abnormal results are displayed) Labs Reviewed  TROPONIN I (HIGH SENSITIVITY) - Abnormal; Notable for the following components:      Result Value   Troponin I (High Sensitivity) 18 (*)    All other components within normal limits    EKG None  Radiology DG Chest 2 View  Result Date: 09/24/2021 CLINICAL DATA:  Chest discomfort, chest tightness EXAM: CHEST - 2 VIEW COMPARISON:  09/21/2018 FINDINGS: Frontal and lateral views of the chest demonstrate an unremarkable cardiac silhouette. No airspace disease, effusion, or pneumothorax. No acute bony abnormalities. IMPRESSION: 1. No acute intrathoracic process. Electronically Signed   By: Randa Ngo M.D.   On: 09/24/2021 20:21   CT Angio Chest PE W and/or Wo Contrast  Result Date: 09/25/2021 CLINICAL DATA:  Chest pain EXAM: CT ANGIOGRAPHY CHEST WITH CONTRAST TECHNIQUE: Multidetector CT imaging of the chest was performed using the standard protocol during bolus administration of intravenous contrast. Multiplanar CT image reconstructions and MIPs were obtained to evaluate the vascular anatomy. RADIATION DOSE REDUCTION: This exam was performed according to the departmental dose-optimization program which includes automated exposure control, adjustment of the mA and/or kV according to patient size and/or use of iterative reconstruction technique. CONTRAST:  57mL OMNIPAQUE IOHEXOL 350 MG/ML SOLN COMPARISON:  03/31/2020 FINDINGS: Cardiovascular: Satisfactory  opacification of the pulmonary arteries to the segmental level. No evidence of pulmonary embolism. Normal heart size. No pericardial effusion. The ascending thoracic aorta is increased in caliber measuring 4.2 cm, image 96/10. This is compared with 3.9 cm previously. Mild aortic atherosclerosis. Coronary artery calcification noted. Mediastinum/Nodes: No enlarged mediastinal, hilar, or axillary lymph nodes. Thyroid gland, trachea, and esophagus demonstrate no significant findings. Lungs/Pleura: No pleural effusion. No airspace consolidation, atelectasis, or pneumothorax. Upper Abdomen: No acute abnormality within the imaged portions of the upper abdomen. There is relative hypertrophy of the caudate lobe and lateral segment of left hepatic lobe. Musculoskeletal: No chest  wall abnormality. No acute or significant osseous findings. Review of the MIP images confirms the above findings. IMPRESSION: 1. No evidence for acute pulmonary embolus. 2. Increase caliber of ascending thoracic aorta measuring 4.2 cm. This is compared with 3.9 cm previously. Recommend annual imaging followup by CTA or MRA. This recommendation follows 2010 ACCF/AHA/AATS/ACR/ASA/SCA/SCAI/SIR/STS/SVM Guidelines for the Diagnosis and Management of Patients with Thoracic Aortic Disease. Circulation. 2010; 121: Z001-V494. Aortic aneurysm NOS (ICD10-I71.9) 3. Coronary artery calcifications noted. 4. 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 5. Aortic Atherosclerosis (ICD10-I70.0). Electronically Signed   By: Kerby Moors M.D.   On: 09/25/2021 06:44    Procedures Procedures    Medications Ordered in ED Medications  sodium chloride 0.9 % bolus 1,000 mL (1,000 mLs Intravenous New Bag/Given 09/25/21 0609)  iohexol (OMNIPAQUE) 350 MG/ML injection 75 mL (75 mLs Intravenous Contrast Given 09/25/21 0615)    ED Course/ Medical Decision Making/ A&P   HEAR Score: 3                       Medical Decision Making Amount and/or  Complexity of Data Reviewed Radiology: ordered.  Risk Prescription drug management.   Patient presents to the emergency department for evaluation of chest discomfort.  Patient was seen earlier at [facility] and left because of long wait times.  His initial troponin was normal at 16, second troponin elevated at 25.  He had a normal EKG.  Patient was very hypertensive initially, this has self corrected.  He does have a history of hypertension and thinks he has been running on the high side lately, may need adjustments of his medication.  Because he is short of breath did have some mild tachycardia initially, PE was considered.  He also has a known thoracic aortic aneurysm.  CT angio of chest was performed.  No PE noted, aneurysm is slightly increased in size from 4.9-6.7, no complications noted.  Discussed with Dr. Johney Frame, on-call for cardiology.  As the patient did have a cardiac CT with a 0 calcium score 2 years ago, is pain-free currently, has a cardiologist, does not require admission.  Dr. Johney Frame will arrange for follow-up with Dr. Bettina Gavia, patient's primary cardiologist.  He will be discharged with return precautions.        Final Clinical Impression(s) / ED Diagnoses Final diagnoses:  Chest pain, unspecified type  Primary hypertension    Rx / DC Orders ED Discharge Orders          Ordered    Ambulatory referral to Hematology / Oncology        09/25/21 0700              Orpah Greek, MD 09/25/21 0700

## 2021-09-27 ENCOUNTER — Encounter: Payer: Self-pay | Admitting: Family Medicine

## 2021-09-27 DIAGNOSIS — L0291 Cutaneous abscess, unspecified: Secondary | ICD-10-CM | POA: Insufficient documentation

## 2021-10-01 ENCOUNTER — Ambulatory Visit: Payer: Managed Care, Other (non HMO) | Admitting: Family Medicine

## 2021-10-01 ENCOUNTER — Encounter: Payer: Self-pay | Admitting: Family Medicine

## 2021-10-01 ENCOUNTER — Other Ambulatory Visit: Payer: Self-pay

## 2021-10-01 VITALS — BP 152/92 | HR 89 | Temp 98.2°F | Resp 18 | Ht 73.0 in | Wt 231.0 lb

## 2021-10-01 DIAGNOSIS — Z789 Other specified health status: Secondary | ICD-10-CM

## 2021-10-01 DIAGNOSIS — I10 Essential (primary) hypertension: Secondary | ICD-10-CM

## 2021-10-01 DIAGNOSIS — D45 Polycythemia vera: Secondary | ICD-10-CM

## 2021-10-01 DIAGNOSIS — N1831 Chronic kidney disease, stage 3a: Secondary | ICD-10-CM | POA: Diagnosis not present

## 2021-10-01 MED ORDER — NEBIVOLOL HCL 10 MG PO TABS: 10.0000 mg | ORAL_TABLET | Freq: Every day | ORAL | 3 refills | Status: AC

## 2021-10-01 MED ORDER — AMLODIPINE BESYLATE 10 MG PO TABS: 10.0000 mg | ORAL_TABLET | Freq: Every day | ORAL | 3 refills | Status: DC

## 2021-10-01 NOTE — Progress Notes (Signed)
Subjective:    Patient ID: Brandon Phelps, male    DOB: 03-22-1966, 56 y.o.   MRN: [id number]  HPI Patient had a coronary artery CT scan 2 years ago.  At that time his coronary artery calcium score was 0.  We stopped the statin due to rhabdomyolysis with elevated CK level.  He was on losartan for hypertension.  He has known history of stage III chronic kidney disease.  Have not seen the patient in quite some time.  Recently, he went to the emergency room with shortness of breath and tachycardia.  Heart rate stayed above 100 for several hours.  He ultimately left the hospital and went to another facility.  I reviewed all 3 EKGs.  The first 2 shows sinus tachycardia without any ischemia or infarction.  The second 1 shows normal sinus rhythm without any ischemia or infarction.  Initial troponin was 16.  Next troponin was 25.  Third set was 18.  CT angiogram was negative for any PE.  There was mention of coronary artery calcium as well as atherosclerosis in the aorta.  Creatinine was elevated.  I believe that his elevated troponin was likely due to strain from elevated blood pressure, renal insufficiency, dehydration, and tachycardia.  He is here today for follow-up.  He has a known thoracic aortic aneurysm.  This had grown from 3.9-4.2.   Past Medical History:  Diagnosis Date   Abscess    lt thigh   CKD (chronic kidney disease) stage 3, GFR 30-59 ml/min (HCC)    Hyperlipidemia    Hypertension    JAK-2 gene mutation    Rhabdomyolysis    on lipitor   Thoracic aortic aneurysm    4.2 (2023)    Past Surgical History:  Procedure Laterality Date   IRRIGATION AND DEBRIDEMENT ABSCESS     lt thigh   SPINE SURGERY  Cervical Fusion   Current Outpatient Medications on File Prior to Visit  Medication Sig Dispense Refill   clobetasol (OLUX) 0.05 % topical foam APPLY 1 GRAM ONTO THE SKIN AS DIRECTED 50 g 2   losartan (COZAAR) 50 MG tablet Take 1 tablet (50 mg total) by mouth daily. 90 tablet 3    Omega-3 Fatty Acids (FISH OIL) 1000 MG CAPS omega-3 acid ethyl esters 1 gram capsule     pantoprazole (PROTONIX) 40 MG tablet Take 1 tablet (40 mg total) by mouth daily. 30 tablet 3   No current facility-administered medications on file prior to visit.   Allergies  Allergen Reactions   Other     Other reaction(s): Edema   Social History   Socioeconomic History   Marital status: Married    Spouse name: Not on file   Number of children: Not on file   Years of education: Not on file   Highest education level: Not on file  Occupational History   Not on file  Tobacco Use   Smoking status: Never   Smokeless tobacco: Never  Vaping Use   Vaping Use: Never used  Substance and Sexual Activity   Alcohol use: No   Drug use: No   Sexual activity: Not on file  Other Topics Concern   Not on file  Social History Narrative   Not on file   Social Determinants of Health   Financial Resource Strain: Not on file  Food Insecurity: Not on file  Transportation Needs: Not on file  Physical Activity: Not on file  Stress: Not on file  Social Connections: Not on  file  Intimate Partner Violence: Not on file    Past Medical History:  Diagnosis Date   Abscess    lt thigh   Hyperlipidemia    Hypertension    Past Surgical History:  Procedure Laterality Date   IRRIGATION AND DEBRIDEMENT ABSCESS     lt thigh   SPINE SURGERY  Cervical Fusion   Current Outpatient Medications on File Prior to Visit  Medication Sig Dispense Refill   clobetasol (OLUX) 0.05 % topical foam APPLY 1 GRAM ONTO THE SKIN AS DIRECTED 50 g 2   losartan (COZAAR) 50 MG tablet Take 1 tablet (50 mg total) by mouth daily. 90 tablet 3   Omega-3 Fatty Acids (FISH OIL) 1000 MG CAPS omega-3 acid ethyl esters 1 gram capsule     pantoprazole (PROTONIX) 40 MG tablet Take 1 tablet (40 mg total) by mouth daily. 30 tablet 3   No current facility-administered medications on file prior to visit.   Allergies  Allergen Reactions    Other     Other reaction(s): Edema   Social History   Socioeconomic History   Marital status: Married    Spouse name: Not on file   Number of children: Not on file   Years of education: Not on file   Highest education level: Not on file  Occupational History   Not on file  Tobacco Use   Smoking status: Never   Smokeless tobacco: Never  Vaping Use   Vaping Use: Never used  Substance and Sexual Activity   Alcohol use: No   Drug use: No   Sexual activity: Not on file  Other Topics Concern   Not on file  Social History Narrative   Not on file   Social Determinants of Health   Financial Resource Strain: Not on file  Food Insecurity: Not on file  Transportation Needs: Not on file  Physical Activity: Not on file  Stress: Not on file  Social Connections: Not on file  Intimate Partner Violence: Not on file      Review of Systems  All other systems reviewed and are negative.     Objective:   Physical Exam Vitals reviewed.  Constitutional:      General: He is not in acute distress.    Appearance: He is well-developed. He is not diaphoretic.  HENT:     Right Ear: External ear normal.     Left Ear: External ear normal.     Nose: Nose normal.     Mouth/Throat:     Pharynx: No oropharyngeal exudate.  Neck:     Thyroid: No thyromegaly.     Vascular: No JVD.  Cardiovascular:     Rate and Rhythm: Normal rate and regular rhythm.     Heart sounds: Normal heart sounds. No murmur heard.   No friction rub. No gallop.  Pulmonary:     Effort: Pulmonary effort is normal. No respiratory distress.     Breath sounds: Normal breath sounds. No wheezing or rales.  Chest:     Chest wall: No tenderness.  Abdominal:     General: Bowel sounds are normal. There is no distension.     Palpations: Abdomen is soft.     Tenderness: There is no abdominal tenderness. There is no guarding or rebound.  Musculoskeletal:     Cervical back: Neck supple.  Lymphadenopathy:     Cervical: No  cervical adenopathy.          Assessment & Plan:  Benign essential HTN -  Plan: Lipid panel, COMPLETE METABOLIC PANEL WITH GFR  Essential hypertension  Polycythemia vera (HCC)  Statin intolerance  Stage 3a chronic kidney disease ([place]) Patient's blood pressure is elevated.  I recommended continuing losartan 50 mg a day but adding Bystolic 10 mg a day to help lower his blood pressure and prevent tachycardia.  Patient will try the Bystolic but he is also asking me to send a prescription for amlodipine 10 mg a day to his pharmacy.  If he cannot tolerate the Bystolic (due to side effects such as fatigue or ED) we will switch to amlodipine 10 mg a day.  He will let me know in 1 to 2 weeks how his blood pressure is doing on the Bystolic whether he plans to continue this.  I explained to the patient that I believe his elevated troponin was secondary to tachycardia, elevated blood pressure, and his chronic kidney disease.  I believe the Bystolic will help address those issues would also explained to the patient keeping his blood pressure down is the best way to prevent further kidney damage.  He has an appointment to see his oncologist for his polycythemia.  Hemoglobin was 16.3 at the hospital.  I would like the patient to come in for fasting labs to check liver function test and his cholesterol.  CT scan made mention of cirrhosis.  Hepatitis serologies in 2021 negative.  He does not drink alcohol.  I believe the cirrhosis could be due to fatty liver disease.  If his cholesterol remains extremely high, we can try him on Zetia.

## 2021-10-03 ENCOUNTER — Ambulatory Visit: Payer: Managed Care, Other (non HMO) | Admitting: Cardiology

## 2021-10-03 ENCOUNTER — Encounter: Payer: Self-pay | Admitting: Cardiology

## 2021-10-03 ENCOUNTER — Other Ambulatory Visit: Payer: Self-pay

## 2021-10-03 VITALS — BP 110/68 | HR 64 | Ht 73.0 in | Wt 227.0 lb

## 2021-10-03 DIAGNOSIS — R072 Precordial pain: Secondary | ICD-10-CM

## 2021-10-03 DIAGNOSIS — Q245 Malformation of coronary vessels: Secondary | ICD-10-CM

## 2021-10-03 DIAGNOSIS — I7789 Other specified disorders of arteries and arterioles: Secondary | ICD-10-CM | POA: Diagnosis not present

## 2021-10-03 DIAGNOSIS — Z789 Other specified health status: Secondary | ICD-10-CM

## 2021-10-03 DIAGNOSIS — K7460 Unspecified cirrhosis of liver: Secondary | ICD-10-CM

## 2021-10-03 DIAGNOSIS — I10 Essential (primary) hypertension: Secondary | ICD-10-CM | POA: Diagnosis not present

## 2021-10-03 MED ORDER — EZETIMIBE 10 MG PO TABS: 10.0000 mg | ORAL_TABLET | Freq: Every day | ORAL | 3 refills | Status: AC

## 2021-10-03 NOTE — Patient Instructions (Addendum)
Medication Instructions:  Your physician has recommended you make the following change in your medication:  START: Zetia 10 mg take one tablet by mouth daily.  *If you need a refill on your cardiac medications before your next appointment, please call your pharmacy*   Lab Work: Your physician recommends that you return for lab work in: Within one week of your cardiac CT BMP  In 6 weeks:  Lipids, Lpa If you have labs (blood work) drawn today and your tests are completely normal, you will receive your results only by: [place] (if you have MyChart) OR A paper copy in the mail If you have any lab test that is abnormal or we need to change your treatment, we will call you to review the results.   Testing/Procedures:  We have placed the order for you to have an ultrasound completed of your liver.     Your cardiac CT will be scheduled at the below location:   [facility] [address] [place], [place] [address] [phone]  If scheduled at [facility], please arrive at the [facility] main entrance (entrance A) of [facility] 30 minutes prior to test start time. You can use the FREE valet parking offered at the main entrance (encouraged to control the heart rate for the test) Proceed to the [facility] Department (first floor) to check-in and test prep.  Please follow these instructions carefully (unless otherwise directed):  On the Night Before the Test: Be sure to Drink plenty of water. Do not consume any caffeinated/decaffeinated beverages or chocolate 12 hours prior to your test. Do not take any antihistamines 12 hours prior to your test.  On the Day of the Test: Drink plenty of water until 1 hour prior to the test. Do not eat any food 4 hours prior to the test. You may take your regular medications prior to the test.   After the Test: Drink plenty of water. After receiving IV contrast, you may experience a mild flushed  feeling. This is normal. On occasion, you may experience a mild rash up to 24 hours after the test. This is not dangerous. If this occurs, you can take Benadryl 25 mg and increase your fluid intake. If you experience trouble breathing, this can be serious. If it is severe call 911 IMMEDIATELY. If it is mild, please call our office. If you take any of these medications: Glipizide/Metformin, Avandament, Glucavance, please do not take 48 hours after completing test unless otherwise instructed.  We will call to schedule your test 2-4 weeks out understanding that some insurance companies will need an authorization prior to the service being performed.   For non-scheduling related questions, please contact the cardiac imaging nurse navigator should you have any questions/concerns: Marchia Bond, Cardiac Imaging Nurse Navigator Gordy Clement, Cardiac Imaging Nurse Navigator [facility] Direct Office Dial: [phone]   For scheduling needs, including cancellations and rescheduling, please call [place], [phone].    Follow-Up: At [facility], you and your health needs are our priority.  As part of our continuing mission to provide you with exceptional heart care, we have created designated Provider Care Teams.  These Care Teams include your primary Cardiologist (physician) and Advanced Practice Providers (APPs -  Physician Assistants and Nurse Practitioners) who all work together to provide you with the care you need, when you need it.  We recommend signing up for the patient portal called "MyChart".  Sign up information is provided  on this After Visit Summary.  MyChart is used to connect with patients for Virtual Visits (Telemedicine).  Patients are able to view lab/test results, encounter notes, upcoming appointments, etc.  Non-urgent messages can be sent to your provider as well.   To learn more about what you can do with MyChart, go to [email or web address].     Your next appointment:   6 month(s)  The format for your next appointment:   In Person  Provider:   Shirlee More, MD    Other Instructions

## 2021-10-03 NOTE — Progress Notes (Signed)
Cardiology Office Note:    Date:  10/03/2021   ID:  OZIAH VITANZA, DOB 1966-07-08, MRN [id number]  PCP:  Susy Frizzle, MD  Cardiologist:  Shirlee More, MD    Referring MD: Susy Frizzle, MD    ASSESSMENT:    1. Coronary-myocardial bridge   2. Enlarged thoracic aorta ([place])   3. Essential hypertension   4. Statin intolerance    PLAN:    In order of problems listed above:  Recent ED visit with nonexertional chest pain he had new coronary artery calcification recheck a cardiac CTA initiate lipid-lowering therapy with nonstatin and reimage his ascending aorta for a gated caliber and recheck yearly noncontrast Much improved continue beta-blocker Initiate lipid-lowering nonstatin Zetia recheck labs including LP(a) 6 to 8 weeks may need PCSK9 inhibitor Check ultrasound liver with question of cirrhosis   Next appointment: 6 months   Medication Adjustments/Labs and Tests Ordered: Current medicines are reviewed at length with the patient today.  Concerns regarding medicines are outlined above.  No orders of the defined types were placed in this encounter.  No orders of the defined types were placed in this encounter.   Chief complaint follow-up after ED visit   History of Present Illness:    BEARL TALARICO is a 56 y.o. male with a hx of coronary myocardial bridge and cardiac CTA without obstructive CAD enlargement ascending aorta 41-42 mm CKD hypertension and hyperlipidemia last seen 11/19/2019.  Compliance with diet, lifestyle and medications: Yes  The day went to the emergency room he is short of breath having nonexertional chest pain his heart was rapid and his blood pressure is elevated and he was quite concerned because a friend of his had just died of heart disease  Saw his family doctor very appropriately placed on by [facility] by pressure controlled and no further symptoms  CT showed some mild progression of his aortic caliber he also has new coronary artery  calcification and a question of liver disease  After discussion we will start nonstatin lipid-lowering Zetia labs in 6 to 8 weeks Check duplex of his liver to resolve the question of disease fatty infiltration or cirrhosis Recheck his coronary CTA and also the caliber of his aorta at times it can be difficult during a pulmonary embolism protocol CT  At follow-up CT angiography of the chest performed 03/31/2020 showing stable ascending aorta 41 mm.  He wants [facility] emergency room for chest pain 09/25/2021.  Is described as nonexertional sharp and nonanginal in nature. Is initial high-sensitivity troponin was mildly elevated 25 with a negative delta He had another CT angio of the chest that showed stable ascending aorta without evidence of aortopathy and coronary artery calcification and the appearance of the liver suggested cirrhosis.  No finding of pulmonary embolism  Chart review shows coronary CT angiography 10/07/2018 with a calcium score of 0 no evidence of CAD and myocardial bridge noted in the ramus branch  Past Medical History:  Diagnosis Date   Abscess    lt thigh   CKD (chronic kidney disease) stage 3, GFR 30-59 ml/min (HCC)    Hyperlipidemia    Hypertension    JAK-2 gene mutation    Rhabdomyolysis    on lipitor   Thoracic aortic aneurysm    4.2 (2023)    Past Surgical History:  Procedure Laterality Date   IRRIGATION AND DEBRIDEMENT ABSCESS     lt thigh   SPINE SURGERY  Cervical Fusion  Current Medications: No outpatient medications have been marked as taking for the 10/03/21 encounter (Appointment) with Richardo Priest, MD.     Allergies:   Other   Social History   Socioeconomic History   Marital status: Married    Spouse name: Not on file   Number of children: Not on file   Years of education: Not on file   Highest education level: Not on file  Occupational History   Not on file  Tobacco Use   Smoking status: Never   Smokeless tobacco:  Never  Vaping Use   Vaping Use: Never used  Substance and Sexual Activity   Alcohol use: No   Drug use: No   Sexual activity: Not on file  Other Topics Concern   Not on file  Social History Narrative   Not on file   Social Determinants of Health   Financial Resource Strain: Not on file  Food Insecurity: Not on file  Transportation Needs: Not on file  Physical Activity: Not on file  Stress: Not on file  Social Connections: Not on file     Family History: The patient's family history includes Heart attack in his maternal uncle and mother; Hyperlipidemia in his father; Hypertension in his father; Lung cancer in his maternal grandfather. ROS:   Please see the history of present illness.    All other systems reviewed and are negative.  EKGs/Labs/Other Studies Reviewed:    The following studies were reviewed today:   Recent Labs: 09/24/2021: BUN 13; Creatinine, Ser 1.43; Hemoglobin 16.3; Platelets 240; Potassium 4.3; Sodium 136  Recent Lipid Panel    Component Value Date/Time   CHOL 235 (H) 03/28/2020 1423   CHOL 135 09/08/2018 0937   TRIG 119 03/28/2020 1423   HDL 22 (L) 03/28/2020 1423   HDL 43 09/08/2018 0937   CHOLHDL 10.7 (H) 03/28/2020 1423   VLDL 19 05/30/2015 0826   LDLCALC 188 (H) 03/28/2020 1423    Physical Exam:    VS:  There were no vitals taken for this visit.    Wt Readings from Last 3 Encounters:  10/01/21 231 lb (104.8 kg)  09/25/21 225 lb (102.1 kg)  11/19/19 225 lb 1.3 oz (102.1 kg)     GEN:  Well nourished, well developed in no acute distress HEENT: Normal NECK: No JVD; No carotid bruits LYMPHATICS: No lymphadenopathy CARDIAC: RRR, no murmurs, rubs, gallops RESPIRATORY:  Clear to auscultation without rales, wheezing or rhonchi  ABDOMEN: Soft, non-tender, non-distended MUSCULOSKELETAL:  No edema; No deformity  SKIN: Warm and dry NEUROLOGIC:  Alert and oriented x 3 PSYCHIATRIC:  Normal affect    Signed, Shirlee More, MD  10/03/2021  10:14 AM    [place]

## 2021-10-05 ENCOUNTER — Other Ambulatory Visit: Payer: Self-pay

## 2021-10-05 ENCOUNTER — Other Ambulatory Visit: Payer: Managed Care, Other (non HMO)

## 2021-10-05 DIAGNOSIS — I10 Essential (primary) hypertension: Secondary | ICD-10-CM

## 2021-10-06 LAB — COMPLETE METABOLIC PANEL WITH GFR
AG Ratio: 1.3 (calc) (ref 1.0–2.5)
ALT: 24 U/L (ref 9–46)
AST: 27 U/L (ref 10–35)
Albumin: 3.9 g/dL (ref 3.6–5.1)
Alkaline phosphatase (APISO): 33 U/L — ABNORMAL LOW (ref 35–144)
BUN/Creatinine Ratio: 10 (calc) (ref 6–22)
BUN: 15 mg/dL (ref 7–25)
CO2: 32 mmol/L (ref 20–32)
Calcium: 9.6 mg/dL (ref 8.6–10.3)
Chloride: 100 mmol/L (ref 98–110)
Creat: 1.49 mg/dL — ABNORMAL HIGH (ref 0.70–1.30)
Globulin: 2.9 g/dL (calc) (ref 1.9–3.7)
Glucose, Bld: 78 mg/dL (ref 65–99)
Potassium: 4.8 mmol/L (ref 3.5–5.3)
Sodium: 138 mmol/L (ref 135–146)
Total Bilirubin: 0.9 mg/dL (ref 0.2–1.2)
Total Protein: 6.8 g/dL (ref 6.1–8.1)
eGFR: 56 mL/min/{1.73_m2} — ABNORMAL LOW (ref >=60)

## 2021-10-06 LAB — LIPID PANEL
Cholesterol: 188 mg/dL (ref <200)
HDL: 16 mg/dL — ABNORMAL LOW (ref >=40)
LDL Cholesterol (Calc): 148 mg/dL (calc) — ABNORMAL HIGH
Non-HDL Cholesterol (Calc): 172 mg/dL (calc) — ABNORMAL HIGH (ref <130)
Total CHOL/HDL Ratio: 11.8 (calc) — ABNORMAL HIGH (ref <5.0)
Triglycerides: 121 mg/dL (ref <150)

## 2021-10-08 ENCOUNTER — Telehealth (HOSPITAL_BASED_OUTPATIENT_CLINIC_OR_DEPARTMENT_OTHER): Payer: Self-pay

## 2021-10-10 ENCOUNTER — Inpatient Hospital Stay: Payer: Managed Care, Other (non HMO) | Attending: Hematology | Admitting: Hematology

## 2021-10-10 ENCOUNTER — Inpatient Hospital Stay: Payer: Managed Care, Other (non HMO)

## 2021-10-10 ENCOUNTER — Other Ambulatory Visit: Payer: Self-pay

## 2021-10-10 VITALS — BP 119/71 | HR 53 | Temp 97.9°F | Resp 20 | Wt 229.2 lb

## 2021-10-10 DIAGNOSIS — D751 Secondary polycythemia: Secondary | ICD-10-CM | POA: Diagnosis present

## 2021-10-10 DIAGNOSIS — Z79899 Other long term (current) drug therapy: Secondary | ICD-10-CM | POA: Insufficient documentation

## 2021-10-10 DIAGNOSIS — Z801 Family history of malignant neoplasm of trachea, bronchus and lung: Secondary | ICD-10-CM | POA: Insufficient documentation

## 2021-10-10 LAB — CMP (CANCER CENTER ONLY)
ALT: 61 U/L — ABNORMAL HIGH (ref 0–44)
AST: 43 U/L — ABNORMAL HIGH (ref 15–41)
Albumin: 3.6 g/dL (ref 3.5–5.0)
Alkaline Phosphatase: 40 U/L (ref 38–126)
Anion gap: 7 (ref 5–15)
BUN: 16 mg/dL (ref 6–20)
CO2: 25 mmol/L (ref 22–32)
Calcium: 8.9 mg/dL (ref 8.9–10.3)
Chloride: 102 mmol/L (ref 98–111)
Creatinine: 1.44 mg/dL — ABNORMAL HIGH (ref 0.61–1.24)
GFR, Estimated: 58 mL/min — ABNORMAL LOW (ref >60)
Glucose, Bld: 95 mg/dL (ref 70–99)
Potassium: 4.6 mmol/L (ref 3.5–5.1)
Sodium: 134 mmol/L — ABNORMAL LOW (ref 135–145)
Total Bilirubin: 0.4 mg/dL (ref 0.3–1.2)
Total Protein: 7 g/dL (ref 6.5–8.1)

## 2021-10-10 LAB — CBC WITH DIFFERENTIAL/PLATELET
Abs Immature Granulocytes: 0.04 10*3/uL (ref 0.00–0.07)
Basophils Absolute: 0.1 10*3/uL (ref 0.0–0.1)
Basophils Relative: 1 %
Eosinophils Absolute: 0.2 10*3/uL (ref 0.0–0.5)
Eosinophils Relative: 3 %
HCT: 48.2 % (ref 39.0–52.0)
Hemoglobin: 16.1 g/dL (ref 13.0–17.0)
Immature Granulocytes: 1 %
Lymphocytes Relative: 24 %
Lymphs Abs: 1.8 10*3/uL (ref 0.7–4.0)
MCH: 29.8 pg (ref 26.0–34.0)
MCHC: 33.4 g/dL (ref 30.0–36.0)
MCV: 89.1 fL (ref 80.0–100.0)
Monocytes Absolute: 0.7 10*3/uL (ref 0.1–1.0)
Monocytes Relative: 10 %
Neutro Abs: 4.8 10*3/uL (ref 1.7–7.7)
Neutrophils Relative %: 61 %
Platelets: 422 10*3/uL — ABNORMAL HIGH (ref 150–400)
RBC: 5.41 MIL/uL (ref 4.22–5.81)
RDW: 14.6 % (ref 11.5–15.5)
WBC: 7.7 10*3/uL (ref 4.0–10.5)
nRBC: 0 % (ref 0.0–0.2)

## 2021-10-10 LAB — FERRITIN: Ferritin: 474 ng/mL — ABNORMAL HIGH (ref 24–336)

## 2021-10-10 LAB — IRON AND IRON BINDING CAPACITY (CC-WL,HP ONLY)
Iron: 134 ug/dL (ref 45–182)
Saturation Ratios: 27 % (ref 17.9–39.5)
TIBC: 493 ug/dL — ABNORMAL HIGH (ref 250–450)
UIBC: 359 ug/dL

## 2021-10-10 LAB — ABO/RH: ABO/RH(D): O POS

## 2021-10-10 NOTE — Progress Notes (Addendum)
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 male who has been referred to [place] by Dr Dennard Schaumann, Cammie Mcgee, MD for, for evaluation and management of polycythemia.  Patient was previously seen at the [facility] by Dr. Walden Field in 2020 and was evaluated for polycythemia and was noted to be negative for JAK2 mutation testing ruling out polycythemia vera.  BCR-ABL testing was also negative. Erythropoietin levels within normal limits at 9.2. All results pretty much ruled out polycythemia vera. Patient did have some phlebotomies due to significantly elevated risk from secondary polycythemia.  Patient had hemochromatosis gene panel test since ferritin was 248, iron saturation 32%. He was noted to be a carrier for H63D with a single mutation..  Patient has not needed repeat therapeutic phlebotomies. His recent labs 09/24/2021 showed no polycythemia with a hemoglobin of 16.3 and hematocrit of 48.9 and normal WBC count and platelets.    MEDICAL HISTORY:  Past Medical History:  Diagnosis Date   Abscess    lt thigh   CKD (chronic kidney disease) stage 3, GFR 30-59 ml/min (HCC)    Hyperlipidemia    Hypertension        Rhabdomyolysis    on lipitor   Thoracic aortic aneurysm    4.2 (2023)    SURGICAL HISTORY: Past Surgical History:  Procedure Laterality Date   IRRIGATION AND DEBRIDEMENT ABSCESS     lt thigh   SPINE SURGERY  Cervical Fusion    SOCIAL HISTORY: Social History   Socioeconomic History   Marital status: Married    Spouse name: Not on file   Number of children: Not on file   Years of education: Not on file   Highest education level: Not on file  Occupational History    Not on file  Tobacco Use   Smoking status: Never    Passive exposure: Never   Smokeless tobacco: Never  Vaping Use   Vaping Use: Never used  Substance and Sexual Activity   Alcohol use: No   Drug use: No   Sexual activity: Not on file  Other Topics Concern   Not on file  Social History Narrative   Not on file   Social Determinants of Health   Financial Resource Strain: Not on file  Food Insecurity: Not on file  Transportation Needs: Not on file  Physical Activity: Not on file  Stress: Not on file  Social Connections: Not on file  Intimate Partner Violence: Not on file    FAMILY HISTORY: Family History  Problem Relation Age of Onset   Heart attack Mother    Hypertension Father    Hyperlipidemia Father    Heart attack Maternal Uncle    Lung cancer Maternal Grandfather     ALLERGIES:  is allergic to other.  MEDICATIONS:  Current Outpatient Medications  Medication Sig Dispense Refill   clobetasol (OLUX) 0.05 % topical foam APPLY 1 GRAM ONTO THE SKIN AS DIRECTED 50 g 2   ezetimibe (ZETIA) 10 MG tablet Take 1 tablet (10 mg total) by mouth daily. 90 tablet 3   losartan (COZAAR) 50 MG tablet Take 1 tablet (50 mg total) by mouth daily. 90 tablet 3   nebivolol (BYSTOLIC) 10 MG tablet Take 1 tablet (10  mg total) by mouth daily. 30 tablet 3   Omega-3 Fatty Acids (FISH OIL) 1000 MG CAPS omega-3 acid ethyl esters 1 gram capsule     pantoprazole (PROTONIX) 40 MG tablet Take 1 tablet (40 mg total) by mouth daily. 30 tablet 3   No current facility-administered medications for this visit.    REVIEW OF SYSTEMS:    10 Point review of Systems was done is negative except as noted above.  PHYSICAL EXAMINATION: ECOG PERFORMANCE STATUS:   . Vitals:   10/10/21 1306  BP: 119/71  Pulse: (!) 53  Resp: 20  Temp: 97.9 F (36.6 C)  SpO2: 97%   Filed Weights   10/10/21 1306  Weight: 229 lb 3.2 oz (104 kg)   .Body mass index is 30.24 kg/m.  GENERAL:alert, in no acute  distress and comfortable SKIN: no acute rashes, no significant lesions EYES: conjunctiva are pink and non-injected, sclera anicteric OROPHARYNX: MMM, no exudates, no oropharyngeal erythema or ulceration NECK: supple, no JVD LYMPH:  no palpable lymphadenopathy in the cervical, axillary or inguinal regions LUNGS: clear to auscultation b/l with normal respiratory effort HEART: regular rate & rhythm ABDOMEN:  normoactive bowel sounds , non tender, not distended.  No palpable hepatosplenomegaly Extremity: no pedal edema PSYCH: alert & oriented x 3 with fluent speech NEURO: no focal motor/sensory deficits  LABORATORY DATA:  I have reviewed the data as listed  . CBC Latest Ref Rng & Units 10/10/2021 09/24/2021 03/28/2020  WBC 4.0 - 10.5 K/uL 7.7 7.6 6.8  Hemoglobin 13.0 - 17.0 g/dL 16.1 16.3 16.7  Hematocrit 39.0 - 52.0 % 48.2 48.9 50.3(H)  Platelets 150 - 400 K/uL 422(H) 240 276    . CMP Latest Ref Rng & Units 10/10/2021 10/05/2021 09/24/2021  Glucose 70 - 99 mg/dL 95 78 92  BUN 6 - 20 mg/dL 16 15 13   Creatinine 0.61 - 1.24 mg/dL 1.44(H) 1.49(H) 1.43(H)  Sodium 135 - 145 mmol/L 134(L) 138 136  Potassium 3.5 - 5.1 mmol/L 4.6 4.8 4.3  Chloride 98 - 111 mmol/L 102 100 103  CO2 22 - 32 mmol/L 25 32 23  Calcium 8.9 - 10.3 mg/dL 8.9 9.6 8.8(L)  Total Protein 6.5 - 8.1 g/dL 7.0 6.8 -  Total Bilirubin 0.3 - 1.2 mg/dL 0.4 0.9 -  Alkaline Phos 38 - 126 U/L 40 - -  AST 15 - 41 U/L 43(H) 27 -  ALT 0 - 44 U/L 61(H) 24 -       RADIOGRAPHIC STUDIES: I have personally reviewed the radiological images as listed and agreed with the findings in the report. DG Chest 2 View  Result Date: 09/24/2021 CLINICAL DATA:  Chest discomfort, chest tightness EXAM: CHEST - 2 VIEW COMPARISON:  09/21/2018 FINDINGS: Frontal and lateral views of the chest demonstrate an unremarkable cardiac silhouette. No airspace disease, effusion, or pneumothorax. No acute bony abnormalities. IMPRESSION: 1. No acute intrathoracic  process. Electronically Signed   By: Randa Ngo M.D.   On: 09/24/2021 20:21   CT Angio Chest PE W and/or Wo Contrast  Result Date: 09/25/2021 CLINICAL DATA:  Chest pain EXAM: CT ANGIOGRAPHY CHEST WITH CONTRAST TECHNIQUE: Multidetector CT imaging of the chest was performed using the standard protocol during bolus administration of intravenous contrast. Multiplanar CT image reconstructions and MIPs were obtained to evaluate the vascular anatomy. RADIATION DOSE REDUCTION: This exam was performed according to the departmental dose-optimization program which includes automated exposure control, adjustment of the mA and/or kV according to patient size and/or use of  iterative reconstruction technique. CONTRAST:  25mL OMNIPAQUE IOHEXOL 350 MG/ML SOLN COMPARISON:  03/31/2020 FINDINGS: Cardiovascular: Satisfactory opacification of the pulmonary arteries to the segmental level. No evidence of pulmonary embolism. Normal heart size. No pericardial effusion. The ascending thoracic aorta is increased in caliber measuring 4.2 cm, image 96/10. This is compared with 3.9 cm previously. Mild aortic atherosclerosis. Coronary artery calcification noted. Mediastinum/Nodes: No enlarged mediastinal, hilar, or axillary lymph nodes. Thyroid gland, trachea, and esophagus demonstrate no significant findings. Lungs/Pleura: No pleural effusion. No airspace consolidation, atelectasis, or pneumothorax. Upper Abdomen: No acute abnormality within the imaged portions of the upper abdomen. There is relative hypertrophy of the caudate lobe and lateral segment of left hepatic lobe. Musculoskeletal: No chest wall abnormality. No acute or significant osseous findings. Review of the MIP images confirms the above findings. IMPRESSION: 1. No evidence for acute pulmonary embolus. 2. Increase caliber of ascending thoracic aorta measuring 4.2 cm. This is compared with 3.9 cm previously. Recommend annual imaging followup by CTA or MRA. This recommendation  follows 2010 ACCF/AHA/AATS/ACR/ASA/SCA/SCAI/SIR/STS/SVM Guidelines for the Diagnosis and Management of Patients with Thoracic Aortic Disease. Circulation. 2010; 121: L798-X211. Aortic aneurysm NOS (ICD10-I71.9) 3. Coronary artery calcifications noted. 4. 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 5. Aortic Atherosclerosis (ICD10-I70.0). Electronically Signed   By: Kerby Moors M.D.   On: 09/25/2021 06:44    ASSESSMENT & PLAN:   56 year old gentleman with  1) history of secondary polycythemia. Previous evaluation in 2020 by Dr. Walden Field ruled out polycythemia vera with negative JAK2 mutation testing and normal erythropoietin levels; Plan Labs done today show no polycythemia with a normal hemoglobin of 16.1 and hematocrit of 48.2 with normal WBC count of 7.7k.  Slightly elevated platelets of 422k No indications for additional work-up or therapeutic phlebotomy at this time Elevation of platelets likely transient in the context of liver inflammation previous platelet counts have been within normal limits -Maintain good hydration.  No smoking.  Avoid excess alcohol. 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 Recommended complete abstinence from alcohol use Would recommend PCP consider referral to gastroenterology/hepatology for further evaluation and management  . Orders Placed This Encounter  Procedures   CBC with Differential/Platelet    Standing Status:   Future    Number of Occurrences:   1    Standing Expiration Date:   10/10/2022   CMP ([facility] only)    Standing Status:   Future     Number of Occurrences:   1    Standing Expiration Date:   10/10/2022   Ferritin    Standing Status:   Future    Number of Occurrences:   1    Standing Expiration Date:   10/10/2022   ABO/RH    Standing Status:   Future    Number of Occurrences:   1    Standing Expiration Date:   10/10/2022     All of the patients questions were answered with apparent satisfaction. The patient knows to call the clinic with any problems, questions or concerns.  I spent 30 minutes counseling the patient face to face. The total time spent in the appointment was 40 minutes and more  than 50% was on counseling and direct patient cares.    Sullivan Lone MD [place] AAHIVMS [facility] Hematology/Oncology Physician [facility]

## 2021-10-11 ENCOUNTER — Telehealth: Payer: Self-pay

## 2021-10-11 DIAGNOSIS — R748 Abnormal levels of other serum enzymes: Secondary | ICD-10-CM

## 2021-10-11 NOTE — Telephone Encounter (Signed)
Left message for patient to call back regarding results and recommendations. °

## 2021-10-11 NOTE — Telephone Encounter (Signed)
Pt called to ask if you would review his recent lab results from the oncologist, he is concerned about his elevated liver enzymes and high iron. He states oncology advised him to donate blood soon to help reduce his iron levels. When questioned the pt denied any excess use of Tylenol or alcohol, reports doesn't use either. Pt would like to know if you have any recommendations regarding these labs.  Please advise, thanks!

## 2021-10-11 NOTE — Telephone Encounter (Signed)
-----   Message from Susy Frizzle, MD sent at 10/08/2021  6:46 AM EST ----- Kidney fcn shows stable ckd, focus on bp control to prevent worsening.  CHolesterol is elevated but liver function tests are normal (which is good news).  Does he want to try zetia for cholesterol?  If so zetia 10 mg poqday and recheck in  3 months.

## 2021-10-12 ENCOUNTER — Telehealth (HOSPITAL_COMMUNITY): Payer: Self-pay | Admitting: *Deleted

## 2021-10-12 ENCOUNTER — Ambulatory Visit (HOSPITAL_COMMUNITY): Payer: Managed Care, Other (non HMO)

## 2021-10-12 NOTE — Telephone Encounter (Signed)
Attempted to call patient regarding upcoming cardiac CT appointment. °Left message on voicemail with name and callback number ° °Keyari Kleeman RN Navigator Cardiac Imaging °[facility] °[phone] Office °[phone] Cell ° °

## 2021-10-12 NOTE — Telephone Encounter (Signed)
Spoke with pt and he is agreeable to GI consult. Referral placed. Pt aware someone will contact him to schedule.

## 2021-10-15 ENCOUNTER — Ambulatory Visit (HOSPITAL_COMMUNITY)
Admission: RE | Admit: 2021-10-15 | Discharge: 2021-10-15 | Disposition: A | Discharge status: Home / Self Care | Payer: Managed Care, Other (non HMO) | Source: Ambulatory Visit | Attending: Cardiology | Admitting: Cardiology

## 2021-10-15 ENCOUNTER — Other Ambulatory Visit: Payer: Self-pay | Admitting: Cardiology

## 2021-10-15 ENCOUNTER — Other Ambulatory Visit: Payer: Self-pay

## 2021-10-15 DIAGNOSIS — R072 Precordial pain: Secondary | ICD-10-CM | POA: Insufficient documentation

## 2021-10-15 DIAGNOSIS — I2510 Atherosclerotic heart disease of native coronary artery without angina pectoris: Secondary | ICD-10-CM | POA: Diagnosis not present

## 2021-10-15 DIAGNOSIS — R931 Abnormal findings on diagnostic imaging of heart and coronary circulation: Secondary | ICD-10-CM | POA: Diagnosis not present

## 2021-10-15 MED ORDER — NITROGLYCERIN 0.4 MG SL SUBL
0.8000 mg | SUBLINGUAL_TABLET | Freq: Once | SUBLINGUAL | Status: AC
  Administered 2021-10-15: 0.8 mg via SUBLINGUAL

## 2021-10-15 MED ORDER — NITROGLYCERIN 0.4 MG SL SUBL
SUBLINGUAL_TABLET | SUBLINGUAL | Status: AC
  Filled 2021-10-15: qty 2

## 2021-10-15 MED ORDER — IOHEXOL 350 MG/ML SOLN
95.0000 mL | Freq: Once | INTRAVENOUS | Status: AC | PRN
  Administered 2021-10-15: 95 mL via INTRAVENOUS

## 2021-10-16 ENCOUNTER — Ambulatory Visit (HOSPITAL_COMMUNITY)
Admission: RE | Admit: 2021-10-16 | Discharge: 2021-10-16 | Disposition: A | Discharge status: Home / Self Care | Payer: Managed Care, Other (non HMO) | Source: Ambulatory Visit | Attending: Cardiology | Admitting: Cardiology

## 2021-10-16 DIAGNOSIS — R931 Abnormal findings on diagnostic imaging of heart and coronary circulation: Secondary | ICD-10-CM | POA: Diagnosis not present

## 2021-10-17 ENCOUNTER — Other Ambulatory Visit: Payer: Self-pay | Admitting: Family Medicine

## 2021-10-27 ENCOUNTER — Telehealth (HOSPITAL_BASED_OUTPATIENT_CLINIC_OR_DEPARTMENT_OTHER): Payer: Self-pay

## 2021-11-13 ENCOUNTER — Telehealth (HOSPITAL_BASED_OUTPATIENT_CLINIC_OR_DEPARTMENT_OTHER): Payer: Self-pay

## 2021-11-20 ENCOUNTER — Telehealth: Payer: Self-pay | Admitting: *Deleted

## 2021-11-20 NOTE — Telephone Encounter (Signed)
-----   Message from Richardo Priest, MD sent at 11/19/2021  4:56 PM EDT ----- ?I do not think I ever saw the initial CTA report but I reviewed his ? ?Looks good very mild blockage should not restrict blood flow in his aorta is unchanged in size ? ?Good report ?

## 2021-11-20 NOTE — Telephone Encounter (Signed)
Left message for pt to call us back about Cardiac CT results. ?

## 2021-11-22 ENCOUNTER — Encounter: Payer: Self-pay | Admitting: *Deleted

## 2021-11-22 NOTE — Telephone Encounter (Signed)
Mail box is full and unable to accept messages. Unable to contact this pt about his Cardiac CT results. ?

## 2021-12-06 ENCOUNTER — Other Ambulatory Visit (HOSPITAL_COMMUNITY): Payer: Self-pay

## 2021-12-06 ENCOUNTER — Ambulatory Visit (HOSPITAL_COMMUNITY)
Admission: RE | Admit: 2021-12-06 | Discharge: 2021-12-06 | Disposition: A | Discharge status: Home / Self Care | Payer: Managed Care, Other (non HMO) | Source: Ambulatory Visit | Attending: Cardiology | Admitting: Cardiology

## 2021-12-06 DIAGNOSIS — M79605 Pain in left leg: Secondary | ICD-10-CM

## 2021-12-06 DIAGNOSIS — M7989 Other specified soft tissue disorders: Secondary | ICD-10-CM | POA: Insufficient documentation

## 2021-12-11 DIAGNOSIS — M79662 Pain in left lower leg: Secondary | ICD-10-CM | POA: Insufficient documentation

## 2022-01-04 ENCOUNTER — Ambulatory Visit: Payer: Managed Care, Other (non HMO) | Admitting: Family Medicine

## 2022-01-15 ENCOUNTER — Other Ambulatory Visit: Payer: Self-pay | Admitting: Family Medicine

## 2022-01-16 NOTE — Telephone Encounter (Signed)
Requested medication (s) are due for refill today:   Provider to review ? ?Requested medication (s) are on the active medication list:   Yes ? ?Future visit scheduled:   No ? ? ?Last ordered: 10/18/2021 50 g, 2 refills ? ?Non delegated refill reason returned.  ? ?Requested Prescriptions  ?Pending Prescriptions Disp Refills  ? clobetasol (OLUX) 0.05 % topical foam [Pharmacy Med Name: CLOBETASOL PROPIONATE 0.05% TOP FOA] 50 g 2  ?  Sig: APPLY ONE GRAM ONTO THE SKIN AS DIRECTED  ?  ? Not Delegated - Dermatology:  Corticosteroids Failed - 01/15/2022  1:17 PM  ?  ?  Failed - This refill cannot be delegated  ?  ?  Passed - Valid encounter within last 12 months  ?  Recent Outpatient Visits   ? ?      ? 3 months ago Benign essential HTN  ? [facility], Cammie Mcgee, MD  ? 3 years ago Atypical chest pain  ? [facility], Cammie Mcgee, MD  ? 4 years ago Right lateral epicondylitis  ? [facility]ard, Cammie Mcgee, MD  ? 5 years ago Kidney stone  ? [facility]ard, Cammie Mcgee, MD  ? 6 years ago Rapid heart beat  ? [facility], Cammie Mcgee, MD  ? ?  ?  ?Future Appointments   ? ?        ? In 2 months Munley, Hilton Cork, MD [facility]  ? ?  ? ? ?  ?  ?  ? ?

## 2022-04-03 ENCOUNTER — Ambulatory Visit: Payer: Managed Care, Other (non HMO) | Admitting: Cardiology

## 2022-12-17 ENCOUNTER — Encounter: Payer: Self-pay | Admitting: Family Medicine

## 2022-12-17 ENCOUNTER — Other Ambulatory Visit: Payer: Self-pay | Admitting: Family Medicine

## 2022-12-17 DIAGNOSIS — I7121 Aneurysm of the ascending aorta, without rupture: Secondary | ICD-10-CM

## 2023-01-13 DIAGNOSIS — M79672 Pain in left foot: Secondary | ICD-10-CM | POA: Insufficient documentation

## 2023-01-24 ENCOUNTER — Other Ambulatory Visit: Payer: Managed Care, Other (non HMO)

## 2023-02-18 ENCOUNTER — Ambulatory Visit
Admission: RE | Admit: 2023-02-18 | Discharge: 2023-02-18 | Disposition: A | Discharge status: Home / Self Care | Payer: Managed Care, Other (non HMO) | Source: Ambulatory Visit | Attending: Family Medicine | Admitting: Family Medicine

## 2023-02-18 DIAGNOSIS — I7121 Aneurysm of the ascending aorta, without rupture: Secondary | ICD-10-CM

## 2023-02-18 MED ORDER — IOPAMIDOL (ISOVUE-370) INJECTION 76%
75.0000 mL | Freq: Once | INTRAVENOUS | Status: AC | PRN
  Administered 2023-02-18: 75 mL via INTRAVENOUS

## 2023-04-25 ENCOUNTER — Other Ambulatory Visit (INDEPENDENT_AMBULATORY_CARE_PROVIDER_SITE_OTHER): Payer: Self-pay | Admitting: Family Medicine

## 2023-06-13 ENCOUNTER — Other Ambulatory Visit: Payer: Self-pay | Admitting: Family Medicine

## 2023-06-13 DIAGNOSIS — Z1211 Encounter for screening for malignant neoplasm of colon: Secondary | ICD-10-CM

## 2023-06-13 DIAGNOSIS — Z1212 Encounter for screening for malignant neoplasm of rectum: Secondary | ICD-10-CM

## 2023-10-14 ENCOUNTER — Encounter: Payer: Self-pay | Admitting: Family Medicine

## 2023-10-21 ENCOUNTER — Encounter: Payer: Self-pay | Admitting: Family Medicine

## 2023-10-21 ENCOUNTER — Other Ambulatory Visit: Payer: Self-pay | Admitting: Family Medicine

## 2023-10-21 DIAGNOSIS — J449 Chronic obstructive pulmonary disease, unspecified: Secondary | ICD-10-CM

## 2023-10-30 ENCOUNTER — Encounter (HOSPITAL_BASED_OUTPATIENT_CLINIC_OR_DEPARTMENT_OTHER): Payer: Self-pay

## 2024-03-23 ENCOUNTER — Encounter: Payer: Self-pay | Admitting: Family Medicine

## 2024-04-15 ENCOUNTER — Ambulatory Visit (INDEPENDENT_AMBULATORY_CARE_PROVIDER_SITE_OTHER): Admitting: Family Medicine

## 2024-04-15 ENCOUNTER — Encounter: Payer: Self-pay | Admitting: Family Medicine

## 2024-04-15 VITALS — BP 122/66 | HR 48 | Temp 98.1°F | Ht 73.0 in | Wt 216.4 lb

## 2024-04-15 DIAGNOSIS — Z0000 Encounter for general adult medical examination without abnormal findings: Secondary | ICD-10-CM

## 2024-04-15 DIAGNOSIS — Z1211 Encounter for screening for malignant neoplasm of colon: Secondary | ICD-10-CM

## 2024-04-15 DIAGNOSIS — Z125 Encounter for screening for malignant neoplasm of prostate: Secondary | ICD-10-CM

## 2024-04-15 DIAGNOSIS — Z148 Genetic carrier of other disease: Secondary | ICD-10-CM | POA: Diagnosis not present

## 2024-04-15 DIAGNOSIS — I7121 Aneurysm of the ascending aorta, without rupture: Secondary | ICD-10-CM

## 2024-04-15 DIAGNOSIS — D751 Secondary polycythemia: Secondary | ICD-10-CM

## 2024-04-15 DIAGNOSIS — N1831 Chronic kidney disease, stage 3a: Secondary | ICD-10-CM

## 2024-04-15 DIAGNOSIS — R7989 Other specified abnormal findings of blood chemistry: Secondary | ICD-10-CM

## 2024-04-15 MED ORDER — LOSARTAN POTASSIUM 50 MG PO TABS: 50.0000 mg | ORAL_TABLET | Freq: Every day | ORAL | 3 refills | Status: AC

## 2024-04-15 NOTE — Progress Notes (Signed)
 Subjective:    Patient ID: Brandon Phelps, male    DOB: 1966/01/19, 58 y.o.   MRN: [id number]  HPI Last seen 1/23.  Patient has a history of an ascending aortic aneurysm that was 4.2 cm last year.  He is due for annual imaging.  Last office visit in the COVID system was in 2023.  This was with oncology due to secondary polycythemia.  They mention that the patient is a carrier for hereditary hemochromatosis gene mutation.  They also mention that the patient had elevated liver function test and signs of cirrhosis on the CT scan.  Patient is due for hepatitis screening.  He is also due for fasting lab work.  Patient appears to be due for prostate cancer screening as well as colon cancer screening.  Patient's last creatinine in the coding system was 1.44 indicating a GFR of 58.  He is overdue for urine protein creatinine ratio screening.  Patient is also due for the shingles vaccine, pneumonia vaccine, and the tetanus shot.  He does complain of bruising on his forearms.  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  He also complains of pain on the plantar surfaces of both feet first thing in the morning however it improves after he gets up and starts to walk. Past Medical History:  Diagnosis Date   Abscess    lt thigh   CKD (chronic kidney disease) stage 3, GFR 30-59 ml/min (HCC)    Hyperlipidemia    Hypertension    JAK-2 gene mutation    Rhabdomyolysis    on lipitor   Thoracic aortic aneurysm (HCC)    4.2 (2023)    Past Surgical History:  Procedure Laterality Date   IRRIGATION AND DEBRIDEMENT ABSCESS     lt thigh   SPINE SURGERY  Cervical Fusion   Current Outpatient Medications on File Prior to Visit  Medication Sig Dispense Refill   clobetasol (OLUX) 0.05 % topical foam APPLY ONE GRAM ONTO THE SKIN AS DIRECTED 50 g 2   ezetimibe  (ZETIA ) 10 MG tablet Take 1 tablet (10 mg total) by mouth daily. 90 tablet 3   losartan  (COZAAR ) 50 MG tablet Take 1 tablet (50 mg total) by mouth daily. 90 tablet 3    nebivolol  (BYSTOLIC ) 10 MG tablet Take 1 tablet (10 mg total) by mouth daily. 30 tablet 3   Omega-3 Fatty Acids (FISH OIL) 1000 MG CAPS omega-3 acid ethyl esters 1 gram capsule     pantoprazole  (PROTONIX ) 40 MG tablet Take 1 tablet (40 mg total) by mouth daily. 30 tablet 3   No current facility-administered medications on file prior to visit.   Allergies  Allergen Reactions   Other     Other reaction(s): Edema   Social History   Socioeconomic History   Marital status: Married    Spouse name: Not on file   Number of children: Not on file   Years of education: Not on file   Highest education level: Doctorate  Occupational History   Not on file  Tobacco Use   Smoking status: Never    Passive exposure: Never   Smokeless tobacco: Never  Vaping Use   Vaping status: Never Used  Substance and Sexual Activity   Alcohol use: No   Drug use: No   Sexual activity: Not on file  Other Topics Concern   Not on file  Social History Narrative   Not on file   Social Drivers of Health   Financial Resource Strain: Low Risk  (  04/14/2024)   Overall Financial Resource Strain (CARDIA)    Difficulty of Paying Living Expenses: Not very hard  Food Insecurity: No Food Insecurity (04/14/2024)   Hunger Vital Sign    Worried About Running Out of Food in the Last Year: Never true    Ran Out of Food in the Last Year: Never true  Transportation Needs: No Transportation Needs (04/14/2024)   PRAPARE - Administrator, Civil Service (Medical): No    Lack of Transportation (Non-Medical): No  Physical Activity: Sufficiently Active (04/14/2024)   Exercise Vital Sign    Days of Exercise per Week: 5 days    Minutes of Exercise per Session: 60 min  Stress: Not on file  Social Connections: Unknown (04/14/2024)   Social Connection and Isolation Panel    Frequency of Communication with Friends and Family: More than three times a week    Frequency of Social Gatherings with Friends and Family: More than three  times a week    Attends Religious Services: More than 4 times per year    Active Member of [employer] or Organizations: Patient declined    Attends Engineer, structural: Not on file    Marital Status: Married  Catering manager Violence: Not on file    Past Medical History:  Diagnosis Date   Abscess    lt thigh   CKD (chronic kidney disease) stage 3, GFR 30-59 ml/min (HCC)    Hyperlipidemia    Hypertension    JAK-2 gene mutation    Rhabdomyolysis    on lipitor   Thoracic aortic aneurysm (HCC)    4.2 (2023)   Past Surgical History:  Procedure Laterality Date   IRRIGATION AND DEBRIDEMENT ABSCESS     lt thigh   SPINE SURGERY  Cervical Fusion   Current Outpatient Medications on File Prior to Visit  Medication Sig Dispense Refill   clobetasol (OLUX) 0.05 % topical foam APPLY ONE GRAM ONTO THE SKIN AS DIRECTED 50 g 2   ezetimibe  (ZETIA ) 10 MG tablet Take 1 tablet (10 mg total) by mouth daily. 90 tablet 3   losartan  (COZAAR ) 50 MG tablet Take 1 tablet (50 mg total) by mouth daily. 90 tablet 3   nebivolol  (BYSTOLIC ) 10 MG tablet Take 1 tablet (10 mg total) by mouth daily. 30 tablet 3   Omega-3 Fatty Acids (FISH OIL) 1000 MG CAPS omega-3 acid ethyl esters 1 gram capsule     pantoprazole  (PROTONIX ) 40 MG tablet Take 1 tablet (40 mg total) by mouth daily. 30 tablet 3   No current facility-administered medications on file prior to visit.   Allergies  Allergen Reactions   Other     Other reaction(s): Edema   Social History   Socioeconomic History   Marital status: Married    Spouse name: Not on file   Number of children: Not on file   Years of education: Not on file   Highest education level: Doctorate  Occupational History   Not on file  Tobacco Use   Smoking status: Never    Passive exposure: Never   Smokeless tobacco: Never  Vaping Use   Vaping status: Never Used  Substance and Sexual Activity   Alcohol use: No   Drug use: No   Sexual activity: Not on file   Other Topics Concern   Not on file  Social History Narrative   Not on file   Social Drivers of Health   Financial Resource Strain: Low Risk  (04/14/2024)  Overall Financial Resource Strain (CARDIA)    Difficulty of Paying Living Expenses: Not very hard  Food Insecurity: No Food Insecurity (04/14/2024)   Hunger Vital Sign    Worried About Running Out of Food in the Last Year: Never true    Ran Out of Food in the Last Year: Never true  Transportation Needs: No Transportation Needs (04/14/2024)   PRAPARE - Administrator, Civil Service (Medical): No    Lack of Transportation (Non-Medical): No  Physical Activity: Sufficiently Active (04/14/2024)   Exercise Vital Sign    Days of Exercise per Week: 5 days    Minutes of Exercise per Session: 60 min  Stress: Not on file  Social Connections: Unknown (04/14/2024)   Social Connection and Isolation Panel    Frequency of Communication with Friends and Family: More than three times a week    Frequency of Social Gatherings with Friends and Family: More than three times a week    Attends Religious Services: More than 4 times per year    Active Member of [employer] or Organizations: Patient declined    Attends Banker Meetings: Not on file    Marital Status: Married  Catering manager Violence: Not on file      Review of Systems  All other systems reviewed and are negative.      Objective:   Physical Exam Vitals reviewed.  Constitutional:      General: He is not in acute distress.    Appearance: He is well-developed. He is not diaphoretic.  HENT:     Right Ear: External ear normal.     Left Ear: External ear normal.     Nose: Nose normal.     Mouth/Throat:     Pharynx: No oropharyngeal exudate.  Neck:     Thyroid: No thyromegaly.     Vascular: No JVD.  Cardiovascular:     Rate and Rhythm: Normal rate and regular rhythm.     Heart sounds: Normal heart sounds. No murmur heard.    No friction rub. No gallop.   Pulmonary:     Effort: Pulmonary effort is normal. No respiratory distress.     Breath sounds: Normal breath sounds. No wheezing or rales.  Chest:     Chest wall: No tenderness.  Abdominal:     General: Bowel sounds are normal. There is no distension.     Palpations: Abdomen is soft.     Tenderness: There is no abdominal tenderness. There is no guarding or rebound.  Musculoskeletal:     Cervical back: Neck supple.  Lymphadenopathy:     Cervical: No cervical adenopathy.           Assessment & Plan:  Aneurysm of ascending aorta without rupture (HCC)  Polycythemia, secondary - Plan: CBC with Differential/Platelet, Comprehensive metabolic panel with GFR, Lipid panel  Carrier of hemochromatosis HFE gene mutation - Plan: CBC with Differential/Platelet, Comprehensive metabolic panel with GFR, Hepatitis panel, acute  Elevated LFTs - Plan: Hepatitis panel, acute  General medical exam - Plan: CBC with Differential/Platelet, Comprehensive metabolic panel with GFR, Lipid panel, PSA, Hepatitis panel, acute  Prostate cancer screening - Plan: PSA Patient's blood pressure is outstanding.  I would discontinue Bystolic  due to bradycardia and I would replace with losartan  50 mg a day in an effort to help manage blood pressure and hopefully help prevent progression of chronic kidney disease.  I will check a urine protein creatinine ratio today and if elevated I plan to uptitrate  losartan  as much as the patient can tolerate.  Follow-up elevated liver function test by obtaining a an acute hepatitis panel.  I suspect fatty liver these.  Check PSA to screen for prostate cancer.  Schedule the patient for a CT angiogram of the chest to monitor for progression of his ascending aortic aneurysm.  Recommended the shingles vaccine, pneumonia vaccine, and a tetanus shot.  Patient defers these at the present time.

## 2024-04-16 LAB — COMPREHENSIVE METABOLIC PANEL WITH GFR
AG Ratio: 1.8 (calc) (ref 1.0–2.5)
ALT: 24 U/L (ref 9–46)
AST: 22 U/L (ref 10–35)
Albumin: 4.6 g/dL (ref 3.6–5.1)
Alkaline phosphatase (APISO): 69 U/L (ref 35–144)
BUN: 14 mg/dL (ref 7–25)
CO2: 29 mmol/L (ref 20–32)
Calcium: 10.1 mg/dL (ref 8.6–10.3)
Chloride: 101 mmol/L (ref 98–110)
Creat: 1.25 mg/dL (ref 0.70–1.30)
Globulin: 2.6 g/dL (ref 1.9–3.7)
Glucose, Bld: 100 mg/dL — ABNORMAL HIGH (ref 65–99)
Potassium: 4.9 mmol/L (ref 3.5–5.3)
Sodium: 142 mmol/L (ref 135–146)
Total Bilirubin: 1.2 mg/dL (ref 0.2–1.2)
Total Protein: 7.2 g/dL (ref 6.1–8.1)
eGFR: 68 mL/min/1.73m2 (ref >=60)

## 2024-04-16 LAB — CBC WITH DIFFERENTIAL/PLATELET
Absolute Lymphocytes: 1462 {cells}/uL (ref 850–3900)
Absolute Monocytes: 773 {cells}/uL (ref 200–950)
Basophils Absolute: 67 {cells}/uL (ref 0–200)
Basophils Relative: 0.8 %
Eosinophils Absolute: 143 {cells}/uL (ref 15–500)
Eosinophils Relative: 1.7 %
HCT: 51.3 % — ABNORMAL HIGH (ref 38.5–50.0)
Hemoglobin: 16.7 g/dL (ref 13.2–17.1)
MCH: 30.8 pg (ref 27.0–33.0)
MCHC: 32.6 g/dL (ref 32.0–36.0)
MCV: 94.6 fL (ref 80.0–100.0)
MPV: 10.5 fL (ref 7.5–12.5)
Monocytes Relative: 9.2 %
Neutro Abs: 5956 {cells}/uL (ref 1500–7800)
Neutrophils Relative %: 70.9 %
Platelets: 252 Thousand/uL (ref 140–400)
RBC: 5.42 Million/uL (ref 4.20–5.80)
RDW: 13.5 % (ref 11.0–15.0)
Total Lymphocyte: 17.4 %
WBC: 8.4 Thousand/uL (ref 3.8–10.8)

## 2024-04-16 LAB — LIPID PANEL
Cholesterol: 215 mg/dL — ABNORMAL HIGH (ref <200)
HDL: 56 mg/dL (ref >=40)
LDL Cholesterol (Calc): 141 mg/dL — ABNORMAL HIGH
Non-HDL Cholesterol (Calc): 159 mg/dL — ABNORMAL HIGH (ref <130)
Total CHOL/HDL Ratio: 3.8 (calc) (ref <5.0)
Triglycerides: 81 mg/dL (ref <150)

## 2024-04-16 LAB — PROTEIN / CREATININE RATIO, URINE
Creatinine, Urine: 44 mg/dL (ref 20–320)
Protein/Creat Ratio: 91 mg/g{creat} (ref 25–148)
Protein/Creatinine Ratio: 0.091 mg/mg{creat} (ref 0.025–0.148)
Total Protein, Urine: 4 mg/dL — ABNORMAL LOW (ref 5–25)

## 2024-04-16 LAB — HEPATITIS PANEL, ACUTE
Hep A IgM: NONREACTIVE
Hep B C IgM: NONREACTIVE
Hepatitis B Surface Ag: NONREACTIVE
Hepatitis C Ab: NONREACTIVE

## 2024-04-16 LAB — PSA: PSA: 6.23 ng/mL — ABNORMAL HIGH (ref <=4.00)

## 2024-04-18 ENCOUNTER — Ambulatory Visit: Payer: Self-pay | Admitting: Family Medicine

## 2024-04-20 ENCOUNTER — Other Ambulatory Visit: Payer: Self-pay | Admitting: Family Medicine

## 2024-04-20 MED ORDER — SULFAMETHOXAZOLE-TRIMETHOPRIM 800-160 MG PO TABS: 1.0000 | ORAL_TABLET | Freq: Two times a day (BID) | ORAL | 0 refills | Status: AC

## 2024-05-19 ENCOUNTER — Encounter: Payer: Self-pay | Admitting: Family Medicine

## 2024-05-19 ENCOUNTER — Encounter: Payer: Self-pay | Admitting: Radiology

## 2024-05-19 ENCOUNTER — Telehealth: Payer: Self-pay

## 2024-05-19 DIAGNOSIS — I7121 Aneurysm of the ascending aorta, without rupture: Secondary | ICD-10-CM

## 2024-05-19 LAB — COLOGUARD: COLOGUARD: NEGATIVE

## 2024-05-19 NOTE — Telephone Encounter (Signed)
 Copied from CRM #[id number]. Topic: Clinical - Medical Advice >> May 19, 2024  1:45 PM Rea ORN wrote: Reason for CRM: Bernarda with [facility] [phone] option 1 and 4) called to advise the order for CT needs to be changed to CT Angio Chest Aorta as soon as possible. The pt has an appt tomorrow at 10 am.

## 2024-05-20 ENCOUNTER — Other Ambulatory Visit: Payer: Self-pay | Admitting: Family Medicine

## 2024-05-20 ENCOUNTER — Ambulatory Visit
Admission: RE | Admit: 2024-05-20 | Discharge: 2024-05-20 | Disposition: A | Discharge status: Home / Self Care | Source: Ambulatory Visit | Attending: Family Medicine | Admitting: Family Medicine

## 2024-05-20 DIAGNOSIS — I7121 Aneurysm of the ascending aorta, without rupture: Secondary | ICD-10-CM

## 2024-05-20 DIAGNOSIS — R972 Elevated prostate specific antigen [PSA]: Secondary | ICD-10-CM

## 2024-05-20 DIAGNOSIS — R5383 Other fatigue: Secondary | ICD-10-CM

## 2024-05-20 MED ORDER — IOPAMIDOL (ISOVUE-370) INJECTION 76%
75.0000 mL | Freq: Once | INTRAVENOUS | Status: AC | PRN
  Administered 2024-05-20: 75 mL via INTRAVENOUS

## 2024-05-21 ENCOUNTER — Other Ambulatory Visit

## 2024-05-21 DIAGNOSIS — R972 Elevated prostate specific antigen [PSA]: Secondary | ICD-10-CM

## 2024-05-21 DIAGNOSIS — R5383 Other fatigue: Secondary | ICD-10-CM

## 2024-05-21 LAB — PSA: PSA: 2.21 ng/mL (ref <=4.00)

## 2024-05-21 LAB — TESTOSTERONE: Testosterone: 82 ng/dL — ABNORMAL LOW (ref 250–827)

## 2024-05-24 ENCOUNTER — Ambulatory Visit: Payer: Self-pay | Admitting: Family Medicine

## 2024-05-25 ENCOUNTER — Telehealth: Payer: Self-pay

## 2024-05-25 ENCOUNTER — Other Ambulatory Visit: Payer: Self-pay | Admitting: Family Medicine

## 2024-05-25 DIAGNOSIS — K769 Liver disease, unspecified: Secondary | ICD-10-CM

## 2024-05-25 NOTE — Telephone Encounter (Signed)
 Copied from CRM [id number]. Topic: Clinical - Request for Lab/Test Order >> May 25, 2024  4:09 PM Myrick T wrote: Reason for CRM: Mrs Jabs called to see if the MRI of patients liver could be ordered asap. Please f/u with spouse

## 2024-05-27 ENCOUNTER — Ambulatory Visit: Payer: Self-pay | Admitting: Family Medicine

## 2024-06-04 ENCOUNTER — Ambulatory Visit
Admission: RE | Admit: 2024-06-04 | Discharge: 2024-06-04 | Disposition: A | Discharge status: Home / Self Care | Source: Ambulatory Visit | Attending: Family Medicine | Admitting: Family Medicine

## 2024-06-04 DIAGNOSIS — K769 Liver disease, unspecified: Secondary | ICD-10-CM

## 2024-06-04 MED ORDER — GADOPICLENOL 0.5 MMOL/ML IV SOLN
10.0000 mL | Freq: Once | INTRAVENOUS | Status: AC | PRN
Start: 2024-06-04 — End: 2024-06-04
  Administered 2024-06-04: 10 mL via INTRAVENOUS

## 2024-06-07 ENCOUNTER — Ambulatory Visit: Payer: Self-pay | Admitting: Family Medicine

## 2025-04-22 ENCOUNTER — Encounter: Admitting: Family Medicine
